# Patient Record
Sex: Female | Born: 1945 | Race: White | Hispanic: No | Marital: Married | Smoking: Never smoker
Health system: Southern US, Community
[De-identification: ages and names within clinical notes are randomized; demographics above are authoritative.]

## PROBLEM LIST (undated history)

## (undated) DIAGNOSIS — F419 Anxiety disorder, unspecified: Secondary | ICD-10-CM

## (undated) DIAGNOSIS — D219 Benign neoplasm of connective and other soft tissue, unspecified: Secondary | ICD-10-CM

## (undated) DIAGNOSIS — I10 Essential (primary) hypertension: Secondary | ICD-10-CM

## (undated) DIAGNOSIS — E785 Hyperlipidemia, unspecified: Secondary | ICD-10-CM

## (undated) DIAGNOSIS — K219 Gastro-esophageal reflux disease without esophagitis: Secondary | ICD-10-CM

## (undated) DIAGNOSIS — R51 Headache: Secondary | ICD-10-CM

## (undated) DIAGNOSIS — F410 Panic disorder [episodic paroxysmal anxiety] without agoraphobia: Secondary | ICD-10-CM

## (undated) DIAGNOSIS — M1990 Unspecified osteoarthritis, unspecified site: Secondary | ICD-10-CM

## (undated) HISTORY — DX: Unspecified osteoarthritis, unspecified site: M19.90

## (undated) HISTORY — PX: OTHER SURGICAL HISTORY: SHX169

## (undated) HISTORY — DX: Hyperlipidemia, unspecified: E78.5

## (undated) HISTORY — DX: Anxiety disorder, unspecified: F41.9

## (undated) HISTORY — DX: Headache: R51

## (undated) HISTORY — DX: Gastro-esophageal reflux disease without esophagitis: K21.9

## (undated) HISTORY — DX: Benign neoplasm of connective and other soft tissue, unspecified: D21.9

## (undated) HISTORY — DX: Essential (primary) hypertension: I10

## (undated) HISTORY — PX: BREAST CYST ASPIRATION: SHX578

## (undated) HISTORY — DX: Panic disorder (episodic paroxysmal anxiety): F41.0

## (undated) HISTORY — PX: POLYPECTOMY: SHX149

---

## 1990-05-13 DIAGNOSIS — D219 Benign neoplasm of connective and other soft tissue, unspecified: Secondary | ICD-10-CM

## 1990-05-13 HISTORY — DX: Benign neoplasm of connective and other soft tissue, unspecified: D21.9

## 1990-06-22 HISTORY — PX: DILATION AND CURETTAGE OF UTERUS: SHX78

## 2003-11-01 ENCOUNTER — Encounter: Admission: RE | Admit: 2003-11-01 | Discharge: 2004-01-30 | Payer: Self-pay | Admitting: Family Medicine

## 2004-03-04 ENCOUNTER — Encounter: Admission: RE | Admit: 2004-03-04 | Discharge: 2004-03-04 | Payer: Self-pay | Admitting: *Deleted

## 2004-04-08 ENCOUNTER — Ambulatory Visit: Payer: Self-pay

## 2004-11-13 ENCOUNTER — Ambulatory Visit: Payer: Self-pay | Admitting: Family Medicine

## 2005-07-14 ENCOUNTER — Ambulatory Visit: Payer: Self-pay

## 2006-03-04 ENCOUNTER — Ambulatory Visit: Payer: Self-pay | Admitting: Family Medicine

## 2006-07-15 ENCOUNTER — Ambulatory Visit: Payer: Self-pay

## 2007-05-23 ENCOUNTER — Ambulatory Visit: Payer: Self-pay | Admitting: Internal Medicine

## 2007-06-06 ENCOUNTER — Encounter: Payer: Self-pay | Admitting: Family Medicine

## 2007-06-06 ENCOUNTER — Ambulatory Visit: Payer: Self-pay | Admitting: Internal Medicine

## 2007-06-06 ENCOUNTER — Encounter: Payer: Self-pay | Admitting: Internal Medicine

## 2007-06-07 ENCOUNTER — Ambulatory Visit: Payer: Self-pay | Admitting: Internal Medicine

## 2007-06-14 ENCOUNTER — Ambulatory Visit: Payer: Self-pay | Admitting: Family Medicine

## 2007-06-14 LAB — CONVERTED CEMR LAB
Bilirubin Urine: NEGATIVE
Glucose, Urine, Semiquant: NEGATIVE
Nitrite: NEGATIVE
Protein, U semiquant: NEGATIVE
Specific Gravity, Urine: 1.015
Urobilinogen, UA: 0.2
WBC Urine, dipstick: NEGATIVE

## 2007-06-21 ENCOUNTER — Ambulatory Visit: Payer: Self-pay | Admitting: Family Medicine

## 2007-06-21 DIAGNOSIS — J309 Allergic rhinitis, unspecified: Secondary | ICD-10-CM | POA: Insufficient documentation

## 2007-06-21 DIAGNOSIS — E785 Hyperlipidemia, unspecified: Secondary | ICD-10-CM

## 2007-06-21 DIAGNOSIS — R51 Headache: Secondary | ICD-10-CM

## 2007-06-21 DIAGNOSIS — R519 Headache, unspecified: Secondary | ICD-10-CM | POA: Insufficient documentation

## 2007-06-21 LAB — CONVERTED CEMR LAB
AST: 32 units/L (ref 0–37)
BUN: 8 mg/dL (ref 6–23)
Basophils Absolute: 0 10*3/uL (ref 0.0–0.1)
CO2: 30 meq/L (ref 19–32)
Cholesterol: 309 mg/dL (ref 0–200)
Creatinine, Ser: 0.7 mg/dL (ref 0.4–1.2)
Eosinophils Absolute: 0.2 10*3/uL (ref 0.0–0.6)
Eosinophils Relative: 3.8 % (ref 0.0–5.0)
GFR calc Af Amer: 110 mL/min
Lymphocytes Relative: 21.4 % (ref 12.0–46.0)
Potassium: 3.7 meq/L (ref 3.5–5.1)
RBC: 3.82 M/uL — ABNORMAL LOW (ref 3.87–5.11)
RDW: 13 % (ref 11.5–14.6)
Sodium: 143 meq/L (ref 135–145)
Total Bilirubin: 1.2 mg/dL (ref 0.3–1.2)
Total Protein: 6.1 g/dL (ref 6.0–8.3)
Triglycerides: 112 mg/dL (ref 0–149)
WBC: 4.7 10*3/uL (ref 4.5–10.5)

## 2007-07-26 ENCOUNTER — Ambulatory Visit: Payer: Self-pay

## 2007-09-13 ENCOUNTER — Ambulatory Visit: Payer: Self-pay | Admitting: Family Medicine

## 2007-09-14 LAB — CONVERTED CEMR LAB
ALT: 26 units/L (ref 0–35)
AST: 28 units/L (ref 0–37)
Albumin: 3.7 g/dL (ref 3.5–5.2)
Alkaline Phosphatase: 63 units/L (ref 39–117)
Bilirubin, Direct: 0.1 mg/dL (ref 0.0–0.3)
Cholesterol: 194 mg/dL (ref 0–200)
HDL: 57.1 mg/dL (ref >39.0)
LDL Cholesterol: 124 mg/dL — ABNORMAL HIGH (ref 0–99)
Total Bilirubin: 1.1 mg/dL (ref 0.3–1.2)
Total CHOL/HDL Ratio: 3.4
Total Protein: 6 g/dL (ref 6.0–8.3)
Triglycerides: 65 mg/dL (ref 0–149)
VLDL: 13 mg/dL (ref 0–40)

## 2007-12-27 ENCOUNTER — Telehealth: Payer: Self-pay | Admitting: Family Medicine

## 2008-10-17 ENCOUNTER — Ambulatory Visit: Payer: Self-pay

## 2008-11-08 ENCOUNTER — Ambulatory Visit: Payer: Self-pay | Admitting: Family Medicine

## 2008-11-08 DIAGNOSIS — L723 Sebaceous cyst: Secondary | ICD-10-CM

## 2008-11-20 ENCOUNTER — Ambulatory Visit: Payer: Self-pay | Admitting: Family Medicine

## 2008-11-20 DIAGNOSIS — R05 Cough: Secondary | ICD-10-CM

## 2008-11-20 DIAGNOSIS — K219 Gastro-esophageal reflux disease without esophagitis: Secondary | ICD-10-CM

## 2008-11-20 DIAGNOSIS — I10 Essential (primary) hypertension: Secondary | ICD-10-CM | POA: Insufficient documentation

## 2008-12-06 ENCOUNTER — Ambulatory Visit: Payer: Self-pay | Admitting: Family Medicine

## 2008-12-28 ENCOUNTER — Telehealth: Payer: Self-pay | Admitting: Family Medicine

## 2009-01-23 ENCOUNTER — Telehealth (INDEPENDENT_AMBULATORY_CARE_PROVIDER_SITE_OTHER): Payer: Self-pay | Admitting: *Deleted

## 2009-01-24 ENCOUNTER — Telehealth (INDEPENDENT_AMBULATORY_CARE_PROVIDER_SITE_OTHER): Payer: Self-pay | Admitting: *Deleted

## 2009-06-27 ENCOUNTER — Telehealth: Payer: Self-pay | Admitting: Family Medicine

## 2009-06-28 ENCOUNTER — Ambulatory Visit: Payer: Self-pay | Admitting: Family Medicine

## 2009-08-14 ENCOUNTER — Ambulatory Visit: Payer: Self-pay | Admitting: Family Medicine

## 2009-08-15 LAB — CONVERTED CEMR LAB
AST: 21 units/L (ref 0–37)
Alkaline Phosphatase: 70 units/L (ref 39–117)
Bilirubin, Direct: 0.1 mg/dL (ref 0.0–0.3)
Sodium: 143 meq/L (ref 135–145)
Total Bilirubin: 0.6 mg/dL (ref 0.3–1.2)
Total Protein: 6.5 g/dL (ref 6.0–8.3)
VLDL: 21 mg/dL (ref 0.0–40.0)

## 2009-11-05 ENCOUNTER — Ambulatory Visit: Payer: Self-pay

## 2009-12-26 ENCOUNTER — Telehealth: Payer: Self-pay | Admitting: Family Medicine

## 2010-06-24 ENCOUNTER — Encounter: Payer: Self-pay | Admitting: Internal Medicine

## 2010-07-20 LAB — CONVERTED CEMR LAB
ALT: 24 units/L (ref 0–35)
Albumin: 3.9 g/dL (ref 3.5–5.2)
Alkaline Phosphatase: 75 units/L (ref 39–117)
BUN: 9 mg/dL (ref 6–23)
Creatinine, Ser: 0.7 mg/dL (ref 0.4–1.2)
Direct LDL: 225.4 mg/dL
Eosinophils Relative: 3.5 % (ref 0.0–5.0)
Glucose, Bld: 102 mg/dL — ABNORMAL HIGH (ref 70–99)
HCT: 38.9 % (ref 36.0–46.0)
Lymphocytes Relative: 26.1 % (ref 12.0–46.0)
Lymphs Abs: 0.7 10*3/uL (ref 0.7–4.0)
MCHC: 34.5 g/dL (ref 30.0–36.0)
MCV: 92.2 fL (ref 78.0–100.0)
Platelets: 193 10*3/uL (ref 150.0–400.0)
Potassium: 3.9 meq/L (ref 3.5–5.1)
RDW: 12.1 % (ref 11.5–14.6)
Total Bilirubin: 1.1 mg/dL (ref 0.3–1.2)
Total CHOL/HDL Ratio: 5
VLDL: 18.8 mg/dL (ref 0.0–40.0)

## 2010-07-22 NOTE — Progress Notes (Signed)
Summary: refill question  Phone Note Call from Patient   Caller: Patient Call For: Nelwyn Salisbury MD Summary of Call: Please call pt regarding her labs........ASAP.  Is upset.   [phone] Initial call taken by: Lynann Beaver CMA,  December 26, 2009 4:59 PM    New/Updated Medications: SUMATRIPTAN SUCCINATE 100 MG TABS (SUMATRIPTAN SUCCINATE) 1 as needed headache Prescriptions: SUMATRIPTAN SUCCINATE 100 MG TABS (SUMATRIPTAN SUCCINATE) 1 as needed headache  #9 x 1   Entered by:   Raechel Ache, RN   Authorized by:   Nelwyn Salisbury MD   Signed by:   Raechel Ache, RN on 12/27/2009   Method used:   Historical   RxID:   [id number]

## 2010-07-22 NOTE — Assessment & Plan Note (Signed)
Summary: change meds/dm   Vital Signs:  Patient profile:   65 year old female Weight:      157 pounds Temp:     98.6 degrees F oral Pulse rate:   95 / minute BP sitting:   200 / 120  (left arm) Cuff size:   regular  Vitals Entered By: Alfred Levins, CMA (June 28, 2009 3:27 PM) CC: discuss meds   History of Present Illness: Here for HTN and advice on high cholesterol. She watches her diet closely , but has been unable to tolerate any meds for her lipids. Statins and Zetia cause muscle aches. Even red yeast rice OTC does this to her. Also she has not been able to tolerate any BP meds except Propranolol.   Current Medications (verified): 1)  Propranolol Hcl 40 Mg  Tabs (Propranolol Hcl) .Marland Kitchen.. 1 By Mouth Two Times A Day 2)  Fish Oil   Oil (Fish Oil) .Marland Kitchen.. 1 By Mouth Once Daily 3)  Zyrtec Allergy 10 Mg  Tabs (Cetirizine Hcl) .Marland Kitchen.. 1 Once Daily Prn 4)  Vitamin D 1000 Unit  Tabs (Cholecalciferol) .... Once Daily 5)  Osteo Bi-Flex Joint Shield  Tabs (Misc Natural Products) .... Once Daily 6)  Nexium 40 Mg Cpdr (Esomeprazole Magnesium) .... Q Day 7)  Singulair 10 Mg Tabs (Montelukast Sodium) .... Once Daily 8)  Nasonex 50 Mcg/act Susp (Mometasone Furoate) .... 2 Sprays Each Nostril Once Daily  Allergies (verified): 1)  ! Penicillin 2)  ! * Statins 3)  ! Zetia (Ezetimibe) 4)  ! Norvasc 5)  ! Clonidine Hcl 6)  ! Ace Inhibitors  Past History:  Past Medical History: Reviewed history from 12/06/2008 and no changes required. Allergic rhinitis, sees Dr. Barnetta Chapel Headache Hyperlipidemia Hypertension GERD sees Dr. Luella Cook in [facility] for GYN exams  Past Surgical History: Reviewed history from 06/21/2007 and no changes required. Benign skin lesions removed, per Dr. Jarold Motto  in [facility] colonoscopy 06-06-07 per Dr. Leone Payor, repeat in 3 yrs  Review of Systems  The patient denies anorexia, fever, weight loss, weight gain, vision loss, decreased hearing, hoarseness, chest pain,  syncope, dyspnea on exertion, peripheral edema, prolonged cough, headaches, hemoptysis, abdominal pain, melena, hematochezia, severe indigestion/heartburn, hematuria, incontinence, genital sores, muscle weakness, suspicious skin lesions, transient blindness, difficulty walking, depression, unusual weight change, abnormal bleeding, enlarged lymph nodes, angioedema, breast masses, and testicular masses.    Physical Exam  General:  Well-developed,well-nourished,in no acute distress; alert,appropriate and cooperative throughout examination Neck:  No deformities, masses, or tenderness noted. Lungs:  Normal respiratory effort, chest expands symmetrically. Lungs are clear to auscultation, no crackles or wheezes. Heart:  Normal rate and regular rhythm. S1 and S2 normal without gallop, murmur, click, rub or other extra sounds. Extremities:  no edema   Impression & Recommendations:  Problem # 1:  HYPERTENSION (ICD-401.9)  The following medications were removed from the medication list:    Amlodipine Besylate 5 Mg Tabs (Amlodipine besylate) ..... Once daily    Lasix 20 Mg Tabs (Furosemide) .Marland Kitchen... Take 1 tablet by mouth once a day    Clonidine Hcl 0.1 Mg Tabs (Clonidine hcl) ..... One tablet two times a day Her updated medication list for this problem includes:    Propranolol Hcl 40 Mg Tabs (Propranolol hcl) .Marland Kitchen... 1 by mouth two times a day    Losartan Potassium 100 Mg Tabs (Losartan potassium) ..... Once daily  Problem # 2:  HYPERLIPIDEMIA (ICD-272.4)  The following medications were removed from the medication list:  Zetia 10 Mg Tabs (Ezetimibe) ..... One by mouth daily Her updated medication list for this problem includes:    Niaspan 500 Mg Cr-tabs (Niacin (antihyperlipidemic)) ..... Once daily  Complete Medication List: 1)  Propranolol Hcl 40 Mg Tabs (Propranolol hcl) .Marland Kitchen.. 1 by mouth two times a day 2)  Fish Oil Oil (Fish oil) .Marland Kitchen.. 1 by mouth once daily 3)  Zyrtec Allergy 10 Mg Tabs  (Cetirizine hcl) .Marland Kitchen.. 1 once daily prn 4)  Vitamin D 1000 Unit Tabs (Cholecalciferol) .... Once daily 5)  Osteo Bi-flex Joint Shield Tabs (Misc natural products) .... Once daily 6)  Nexium 40 Mg Cpdr (Esomeprazole magnesium) .... Q day 7)  Singulair 10 Mg Tabs (Montelukast sodium) .... Once daily 8)  Nasonex 50 Mcg/act Susp (Mometasone furoate) .... 2 sprays each nostril once daily 9)  Aspirin 81 Mg Tbec (Aspirin) .... Once daily 10)  Niaspan 500 Mg Cr-tabs (Niacin (antihyperlipidemic)) .... Once daily 11)  Losartan Potassium 100 Mg Tabs (Losartan potassium) .... Once daily  Patient Instructions: 1)  try Losartan for HTN, also try flax seed oil and Niaspan for lipids.  2)  Please schedule a follow-up appointment in 1 month.  Prescriptions: LOSARTAN POTASSIUM 100 MG TABS (LOSARTAN POTASSIUM) once daily  #30 x 11   Entered and Authorized by:   Nelwyn Salisbury MD   Signed by:   Nelwyn Salisbury MD on 06/28/2009   Method used:   [facility]ron[facility] to        [facility] [phone]* (retail)       [address] [place], [place]  [address]       Ph: [phone] or [phone]       Fax: [phone]   RxID:   [id number] NIASPAN 500 MG CR-TABS (NIACIN (ANTIHYPERLIPIDEMIC)) once daily  #30 x 11   Entered and Authorized by:   Nelwyn Salisbury MD   Signed by:   Nelwyn Salisbury MD on 06/28/2009   Method used:   Electronically to        [facility] [phone]* (retail)       [address] [place], [place]  [address]       Ph: [phone] or [phone]       Fax: [phone]   RxID:   [id number]

## 2010-07-22 NOTE — Assessment & Plan Note (Signed)
Summary: 1 month fup//ccm   Vital Signs:  Patient profile:   65 year old female Weight:      155 pounds BMI:     27.12 Temp:     97.8 degrees F oral Pulse rate:   68 / minute BP sitting:   162 / 84  (left arm) Cuff size:   regular  Vitals Entered By: Alfred Levins, CMA (August 14, 2009 8:38 AM) CC: bp check, fasting   History of Present Illness: Here to recheck lipids and BP. She has tolerated Losartan and Niaspan very well. She feels fine. Her BP at home runs 120s-130s over 80s now.   Current Medications (verified): 1)  Propranolol Hcl 40 Mg  Tabs (Propranolol Hcl) .Marland Kitchen.. 1 By Mouth Two Times A Day 2)  Fish Oil   Oil (Fish Oil) .Marland Kitchen.. 1 By Mouth Once Daily 3)  Zyrtec Allergy 10 Mg  Tabs (Cetirizine Hcl) .Marland Kitchen.. 1 Once Daily Prn 4)  Vitamin D 1000 Unit  Tabs (Cholecalciferol) .... Once Daily 5)  Osteo Bi-Flex Joint Shield  Tabs (Misc Natural Products) .... Once Daily 6)  Nexium 40 Mg Cpdr (Esomeprazole Magnesium) .... Q Day 7)  Singulair 10 Mg Tabs (Montelukast Sodium) .... Once Daily 8)  Nasonex 50 Mcg/act Susp (Mometasone Furoate) .... 2 Sprays Each Nostril Once Daily 9)  Aspirin 81 Mg Tbec (Aspirin) .... Once Daily 10)  Niaspan 500 Mg Cr-Tabs (Niacin (Antihyperlipidemic)) .... Once Daily 11)  Losartan Potassium 100 Mg Tabs (Losartan Potassium) .... Once Daily  Allergies (verified): 1)  ! Penicillin 2)  ! * Statins 3)  ! Zetia (Ezetimibe) 4)  ! Norvasc 5)  ! Clonidine Hcl 6)  ! Ace Inhibitors  Past History:  Past Medical History: Reviewed history from 12/06/2008 and no changes required. Allergic rhinitis, sees Dr. Barnetta Chapel Headache Hyperlipidemia Hypertension GERD sees Dr. Luella Cook in [place] for GYN exams  Review of Systems  The patient denies anorexia, fever, weight loss, weight gain, vision loss, decreased hearing, hoarseness, chest pain, syncope, dyspnea on exertion, peripheral edema, prolonged cough, headaches, hemoptysis, abdominal pain, melena, hematochezia,  severe indigestion/heartburn, hematuria, incontinence, genital sores, muscle weakness, suspicious skin lesions, transient blindness, difficulty walking, depression, unusual weight change, abnormal bleeding, enlarged lymph nodes, angioedema, breast masses, and testicular masses.    Physical Exam  General:  Well-developed,well-nourished,in no acute distress; alert,appropriate and cooperative throughout examination Lungs:  Normal respiratory effort, chest expands symmetrically. Lungs are clear to auscultation, no crackles or wheezes. Heart:  Normal rate and regular rhythm. S1 and S2 normal without gallop, murmur, click, rub or other extra sounds.   Impression & Recommendations:  Problem # 1:  HYPERTENSION (ICD-401.9)  Her updated medication list for this problem includes:    Propranolol Hcl 40 Mg Tabs (Propranolol hcl) .Marland Kitchen... 1 by mouth two times a day    Losartan Potassium 100 Mg Tabs (Losartan potassium) ..... Once daily  Problem # 2:  HYPERLIPIDEMIA (ICD-272.4)  Her updated medication list for this problem includes:    Niaspan 500 Mg Cr-tabs (Niacin (antihyperlipidemic)) ..... Once daily  Orders: Venipuncture ([address]) TLB-Lipid Panel (80061-LIPID) TLB-BMP (Basic Metabolic Panel-BMET) (80048-METABOL) TLB-Hepatic/Liver Function Pnl (80076-HEPATIC)  Complete Medication List: 1)  Propranolol Hcl 40 Mg Tabs (Propranolol hcl) .Marland Kitchen.. 1 by mouth two times a day 2)  Fish Oil Oil (Fish oil) .Marland Kitchen.. 1 by mouth once daily 3)  Zyrtec Allergy 10 Mg Tabs (Cetirizine hcl) .Marland Kitchen.. 1 once daily prn 4)  Vitamin D 1000 Unit Tabs (Cholecalciferol) .... Once daily 5)  Osteo  Bi-flex Joint Shield Tabs (Misc natural products) .... Once daily 6)  Nexium 40 Mg Cpdr (Esomeprazole magnesium) .... Q day 7)  Singulair 10 Mg Tabs (Montelukast sodium) .... Once daily 8)  Nasonex 50 Mcg/act Susp (Mometasone furoate) .... 2 sprays each nostril once daily 9)  Aspirin 81 Mg Tbec (Aspirin) .... Once daily 10)  Niaspan 500 Mg  Cr-tabs (Niacin (antihyperlipidemic)) .... Once daily 11)  Losartan Potassium 100 Mg Tabs (Losartan potassium) .... Once daily 12)  Nabumetone 500 Mg Tabs (Nabumetone) .... Two times a day  Patient Instructions: 1)  check labs today Prescriptions: NABUMETONE 500 MG TABS (NABUMETONE) two times a day  #60 x 11   Entered and Authorized by:   Nelwyn Salisbury MD   Signed by:   Nelwyn Salisbury MD on 08/14/2009   Method used:   Electronically to        [facility] [phone]* (retail)       [address] [place], [place]  [address]       Ph: [phone] or [phone]       Fax: [phone]   RxID:   [id number]

## 2010-07-22 NOTE — Progress Notes (Signed)
Summary: med requests  Phone Note Call from Patient   Caller: Patient Call For: Nelwyn Salisbury MD Summary of Call: Pt states the Clonidine is making her too sleepy and wants to change to Losartan.........[facility] ([place], [address]).  Also, the Zetia is causing joint pain, and she wants to try Niaspan. [phone] Initial call taken by: Lynann Beaver CMA,  June 27, 2009 10:52 AM  Follow-up for Phone Call        these are significant changes, so she needs an OV to sort this out Follow-up by: Nelwyn Salisbury MD,  June 27, 2009 12:42 PM  Additional Follow-up for Phone Call Additional follow up Details #1::        Appt scheduled. Additional Follow-up by: Lynann Beaver CMA,  June 27, 2009 12:57 PM

## 2010-07-24 NOTE — Letter (Signed)
Summary: Colonoscopy Letter  [facility]  [address]   [place], [place] [address]   Phone: [phone]  Fax: [phone]      June 24, 2010 MRN: [id number]   [facility] [place] [address] [place], [place]  [address]   Dear Ms. Pilz,   According to your medical record, it is time for you to schedule a Colonoscopy. The American Cancer Society recommends this procedure as a method to detect early colon cancer. Patients with a family history of colon cancer, or a personal history of colon polyps or inflammatory bowel disease are at increased risk.  This letter has been generated based on the recommendations made at the time of your procedure. If you feel that in your particular situation this may no longer apply, please contact our office.  Please call our office at [phone] to schedule this appointment or to update your records at your earliest convenience.  Thank you for cooperating with [place] to provide you with the very best care possible.   Sincerely,   Stan Head, M.D.  [facility] [phone]

## 2010-08-22 ENCOUNTER — Other Ambulatory Visit: Payer: Self-pay | Admitting: Family Medicine

## 2010-09-21 ENCOUNTER — Other Ambulatory Visit: Payer: Self-pay | Admitting: Family Medicine

## 2010-10-07 ENCOUNTER — Other Ambulatory Visit: Payer: Self-pay | Admitting: Family Medicine

## 2010-11-04 NOTE — Assessment & Plan Note (Signed)
[facility]                         GASTROENTEROLOGY OFFICE NOTE   NAME:Patricia Bullock, Patricia Bullock                           MRN:          [id number]  DATE:06/07/2007                            DOB:          1945-08-07    CHIEF COMPLAINT:  Pain after colonoscopy.   HISTORY:  Ms. Puryear had a colonoscopy with the removal of a 1 cm sigmoid  polyp (hot snare), yesterday.  She was fine yesterday.  She went to work  today and she was having left shoulder pain.  When she belches she feels  better.  There is also left lower quadrant pain.  She is passing flatus.  There is no nausea or vomiting or other chest pain or abdominal pain  whatsoever.  I spoke to her on the phone, because our nurse had spoken  to her about this earlier.  She took an ibuprofen and took some soda and  belched and felt better. That was what she told the nurse.  I called her  a couple of hours ago and asked her to come in to be evaluated, because  of these problems.  There is no bleeding.   PAST MEDICAL/SURGICAL HISTORY:  1. Dilatation and curettage.  2. 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  3. Adenomatous colon polyp.  4. External hemorrhoids on colonoscopy yesterday.  5. Migraine headaches.   CURRENT MEDICATIONS:  1. Nabumetone.  2. Propranolol.  3. Zyrtec.  4. Fish oil.  5. Multivitamins.  These doses are reflected in the medical history      form in the chart.  6. Imitrex taken as needed.   PHYSICAL EXAMINATION:  GENERAL:  She is in no acute distress.  NECK:  Nontender.  She has nearly a full range of motion of the neck,  though full lateral extension is a little limited.  Lateral rotation.  SHOULDERS:  She has spasm in the trapezius muscle and tenderness over  that site.  She is tender on the upper aspect of the scapula. There is  no real pain on range of motion of the shoulders.  CHEST:  There is no chest wall tenderness.  LUNGS:  Clear.  ABDOMEN:  Bowel sounds present.  She is soft, nontender.  When she  tenses her muscles in the left lower quadrant, there is some tenderness.  There is no hernia.  There is no rebound or organomegaly or mass.   ASSESSMENT:  I think she has a musculoskeletal strain, really on her  left side; and perhaps from turning her or during the procedure, if she  were bearing down, she may have tensed her abdominal wall muscles.  This  should resolve over time.  I do not think she has a post-polypectomy  syndrome or a perforation or anything like that.  I do not quite  understand why belching makes it better, since it is musculoskeletal,  but the physical examination and history all support a musculoskeletal  process.  She is afebrile today  with a temperature of 98 degrees.  The  blood pressure was  160/90, pulse 60, weight 150 pounds.   PLAN:  I have told her to continue on her nabumetone.  I let her do  that.  I did not hold that after the procedure.  She can use heat,  whirlpool, shower and range of motion exercises.  Additional Tylenol  would be okay.  She is to let me know if she has persistent problems  that do not respond to these conservative measures, or if there is any  change.     Iva Boop, MD,FACG  Electronically Signed    CEG/MedQ  DD: 06/07/2007  DT: 06/08/2007  Job #: [id number]   cc:   Jeannett Senior A. Clent Ridges, MD

## 2010-11-07 NOTE — Assessment & Plan Note (Signed)
[facility] OFFICE NOTE   NAME:Patricia Bullock, Patricia Bullock                           MRN:          [id number]  DATE:03/04/2006                            DOB:          1946/02/20    This is a 65 year old woman here for a nongynecological physical  examination.  In general she feels fine, with no complaints.  She continues  to see Dr. Corinda Gubler for allergy care.  She also sees Dr. Luella Cook for  gynecology exams.  She continues to watch her diet closely, and gets a  little bit of exercise.  Her headaches are doing reasonably well.   For further details of her past medical history, family history, social  history, habits, etcetera, I refer you to her last physical note dated January 17, 2003.   ALLERGIES:  PENICILLIN, ALL STATINS AND ZETIA.   CURRENT MEDICATIONS:  1. Propranolol 40 mg one or two tablets a day.  2. Astelin nasal spray b.i.d.  3. Nasarel sprays once a day.  4. Fish oil daily.  5. Relafen 500 mg two a day.  6. Zyrtec 10 mg per day.  7. Nexium 40 mg per day on an as-needed basis.  8. Imitrex 100 mg tablets as needed.   OBJECTIVE:  Height 5 feet 5 inches, weight 136, BP 140/80, pulse 70 and  regular.  GENERAL:  She appears to be doing well.  SKIN:  Free of significant lesions.  HEENT:  Eyes clear sclerae, clear oropharynx.  NECK:  Supple without lymphadenopathy or masses.  LUNGS:  Clear.  CARDIAC:  Rate and rhythm regular without gallops, murmurs or rubs.  Distal  pulses are full.  EKG is within normal limits.  ABDOMEN:  Soft, normal bowel sounds, nontender, no masses.  EXTREMITIES:  No clubbing, cyanosis or edema.  NEUROLOGIC:  Exam is grossly intact.   ASSESSMENT AND PLAN:  1. Complete physical exam.  We will check the usual laboratories today.      We will also set up for her first screening colonoscopy.  2. Hyperlipidemia.  We will check a fasting lipid panel today.  3. Gastroesophageal reflux disease,  stable.  4. Headaches, stable.  5. Allergies, stable.                                   Tera Mater. Clent Ridges, MD   SAF/MedQ  DD:  03/05/2006  DT:  03/05/2006  Job #:  [id number]

## 2010-12-20 ENCOUNTER — Other Ambulatory Visit: Payer: Self-pay | Admitting: Family Medicine

## 2010-12-23 ENCOUNTER — Other Ambulatory Visit: Payer: Self-pay | Admitting: *Deleted

## 2011-01-27 ENCOUNTER — Other Ambulatory Visit: Payer: Self-pay | Admitting: Family Medicine

## 2011-01-27 NOTE — Telephone Encounter (Signed)
Pt has appt 02/11/2011 - will give 30day RF - must keep appt - last seen 07/2009

## 2011-02-10 ENCOUNTER — Encounter: Payer: Self-pay | Admitting: Family Medicine

## 2011-02-11 ENCOUNTER — Ambulatory Visit (INDEPENDENT_AMBULATORY_CARE_PROVIDER_SITE_OTHER): Payer: BC Managed Care – PPO | Admitting: Family Medicine

## 2011-02-11 ENCOUNTER — Encounter: Payer: Self-pay | Admitting: Family Medicine

## 2011-02-11 VITALS — BP 120/80 | HR 78 | Temp 97.9°F | Ht 64.5 in | Wt 155.0 lb

## 2011-02-11 DIAGNOSIS — E785 Hyperlipidemia, unspecified: Secondary | ICD-10-CM

## 2011-02-11 DIAGNOSIS — Z0000 Encounter for general adult medical examination without abnormal findings: Secondary | ICD-10-CM

## 2011-02-11 LAB — HEPATIC FUNCTION PANEL
AST: 23 U/L (ref 0–37)
Bilirubin, Direct: 0.1 mg/dL (ref 0.0–0.3)

## 2011-02-11 LAB — LIPID PANEL
Cholesterol: 280 mg/dL — ABNORMAL HIGH (ref 0–200)
HDL: 58.1 mg/dL (ref >39.00)
Triglycerides: 123 mg/dL (ref 0.0–149.0)

## 2011-02-11 LAB — POCT URINALYSIS DIPSTICK
Leukocytes, UA: NEGATIVE
Nitrite, UA: NEGATIVE
Protein, UA: NEGATIVE
Urobilinogen, UA: 0.2
pH, UA: 6

## 2011-02-11 LAB — CBC WITH DIFFERENTIAL/PLATELET
Basophils Absolute: 0 10*3/uL (ref 0.0–0.1)
HCT: 39.7 % (ref 36.0–46.0)
Hemoglobin: 13.2 g/dL (ref 12.0–15.0)
Lymphs Abs: 0.9 10*3/uL (ref 0.7–4.0)
MCHC: 33.3 g/dL (ref 30.0–36.0)
MCV: 91.7 fl (ref 78.0–100.0)
Monocytes Absolute: 0.2 10*3/uL (ref 0.1–1.0)
Platelets: 198 10*3/uL (ref 150.0–400.0)
RDW: 13.7 % (ref 11.5–14.6)
WBC: 3.6 10*3/uL — ABNORMAL LOW (ref 4.5–10.5)

## 2011-02-11 LAB — BASIC METABOLIC PANEL
BUN: 12 mg/dL (ref 6–23)
Creatinine, Ser: 0.7 mg/dL (ref 0.4–1.2)
Glucose, Bld: 104 mg/dL — ABNORMAL HIGH (ref 70–99)
Potassium: 4.1 mEq/L (ref 3.5–5.1)
Sodium: 144 mEq/L (ref 135–145)

## 2011-02-11 MED ORDER — ESOMEPRAZOLE MAGNESIUM 40 MG PO CPDR: 40.0000 mg | DELAYED_RELEASE_CAPSULE | Freq: Every day | ORAL | Status: DC

## 2011-02-11 MED ORDER — MONTELUKAST SODIUM 10 MG PO TABS: 10.0000 mg | ORAL_TABLET | Freq: Every day | ORAL | Status: DC

## 2011-02-11 MED ORDER — NABUMETONE 500 MG PO TABS: 500.0000 mg | ORAL_TABLET | Freq: Two times a day (BID) | ORAL | Status: DC

## 2011-02-11 MED ORDER — SCOPOLAMINE 1 MG/3DAYS TD PT72: 1.0000 | MEDICATED_PATCH | TRANSDERMAL | Status: DC

## 2011-02-11 MED ORDER — PROPRANOLOL HCL 40 MG PO TABS: 40.0000 mg | ORAL_TABLET | Freq: Two times a day (BID) | ORAL | Status: DC

## 2011-02-11 MED ORDER — MOMETASONE FUROATE 50 MCG/ACT NA SUSP: 2.0000 | Freq: Every day | NASAL | Status: DC

## 2011-02-11 MED ORDER — LOSARTAN POTASSIUM 100 MG PO TABS: 100.0000 mg | ORAL_TABLET | Freq: Every day | ORAL | Status: DC

## 2011-02-11 NOTE — Progress Notes (Signed)
  Subjective:    Patient ID: Patricia Bullock, female    DOB: 05-28-1946, 65 y.o.   MRN: [id number]  HPI 65 yr old female for a cpx. She feels well and has no complaints. She is seeing her GYN regularly. She is past due for a colonoscopy.    Review of Systems  Constitutional: Negative.   HENT: Negative.   Eyes: Negative.   Respiratory: Negative.   Cardiovascular: Negative.   Gastrointestinal: Negative.   Genitourinary: Negative for dysuria, urgency, frequency, hematuria, flank pain, decreased urine volume, enuresis, difficulty urinating, pelvic pain and dyspareunia.  Musculoskeletal: Negative.   Skin: Negative.   Neurological: Negative.   Hematological: Negative.   Psychiatric/Behavioral: Negative.        Objective:   Physical Exam  Constitutional: She is oriented to person, place, and time. She appears well-developed and well-nourished. No distress.  HENT:  Head: Normocephalic and atraumatic.  Right Ear: External ear normal.  Left Ear: External ear normal.  Nose: Nose normal.  Mouth/Throat: Oropharynx is clear and moist. No oropharyngeal exudate.  Eyes: Conjunctivae and EOM are normal. Pupils are equal, round, and reactive to light. No scleral icterus.  Neck: Normal range of motion. Neck supple. No JVD present. No thyromegaly present.  Cardiovascular: Normal rate, regular rhythm, normal heart sounds and intact distal pulses.  Exam reveals no gallop and no friction rub.   No murmur heard.      EKG normal   Pulmonary/Chest: Effort normal and breath sounds normal. No respiratory distress. She has no wheezes. She has no rales. She exhibits no tenderness.  Abdominal: Soft. Bowel sounds are normal. She exhibits no distension and no mass. There is no tenderness. There is no rebound and no guarding.  Musculoskeletal: Normal range of motion. She exhibits no edema and no tenderness.  Lymphadenopathy:    She has no cervical adenopathy.  Neurological: She is alert and oriented to person,  place, and time. She has normal reflexes. No cranial nerve deficit. She exhibits normal muscle tone. Coordination normal.  Skin: Skin is warm and dry. No rash noted. No erythema.  Psychiatric: She has a normal mood and affect. Her behavior is normal. Judgment and thought content normal.          Assessment & Plan:  Get fasting labs today. Reminded her to set up another colonoscopy soon

## 2011-02-12 ENCOUNTER — Other Ambulatory Visit: Payer: Self-pay | Admitting: Family Medicine

## 2011-02-12 ENCOUNTER — Ambulatory Visit: Payer: Self-pay

## 2011-02-12 MED ORDER — EZETIMIBE 10 MG PO TABS: 10.0000 mg | ORAL_TABLET | Freq: Every day | ORAL | Status: DC

## 2011-02-12 NOTE — Progress Notes (Signed)
Addended by: Aniceto Boss A on: 02/12/2011 03:34 PM   Modules accepted: Orders

## 2011-04-17 ENCOUNTER — Telehealth: Payer: Self-pay | Admitting: Family Medicine

## 2011-04-17 MED ORDER — PRAVASTATIN SODIUM 10 MG PO TABS: 10.0000 mg | ORAL_TABLET | Freq: Every day | ORAL | Status: DC

## 2011-04-17 NOTE — Telephone Encounter (Signed)
She wants to try a low dose of pravachol

## 2011-06-17 ENCOUNTER — Other Ambulatory Visit: Payer: Self-pay | Admitting: Family Medicine

## 2011-10-08 ENCOUNTER — Ambulatory Visit (INDEPENDENT_AMBULATORY_CARE_PROVIDER_SITE_OTHER): Payer: BC Managed Care – PPO | Admitting: Family Medicine

## 2011-10-08 ENCOUNTER — Emergency Department: Payer: Self-pay | Admitting: Emergency Medicine

## 2011-10-08 ENCOUNTER — Telehealth: Payer: Self-pay | Admitting: Family Medicine

## 2011-10-08 ENCOUNTER — Encounter: Payer: Self-pay | Admitting: Family Medicine

## 2011-10-08 VITALS — BP 190/100 | Temp 98.3°F | Wt 160.0 lb

## 2011-10-08 DIAGNOSIS — I10 Essential (primary) hypertension: Secondary | ICD-10-CM

## 2011-10-08 MED ORDER — HYDROCHLOROTHIAZIDE 12.5 MG PO CAPS: 12.5000 mg | ORAL_CAPSULE | Freq: Every day | ORAL | Status: DC

## 2011-10-08 NOTE — Telephone Encounter (Signed)
Walk in - patient concerned about blood pressure.  Patient waiting in [place].

## 2011-10-08 NOTE — Progress Notes (Signed)
  Subjective:    Patient ID: Patricia Bullock, female    DOB: August 24, 1945, 66 y.o.   MRN: [id number]  HPI  Acute visit. Patient seen for elevated blood pressure. She has history of hypertension treated with losartan and propranolol. Earlier today she noticed some bilateral tingling sensation in fingers and feeling of fullness in her head and neck. No chest pain. Initial blood pressure here 200/118. She is dealing with a lot of stress which she thinks may be exacerbating.  Denies any edema. Follows a low-sodium diet. Nonsmoker. No alcohol. No nonsteroidals. She's had similar exacerbation previously. Denies any focal weakness.  Compliant with regular medications.  Past Medical History  Diagnosis Date  . Allergic rhinitis   . Headache   . Hyperlipidemia   . Hypertension   . GERD (gastroesophageal reflux disease)    Past Surgical History  Procedure Date  . Benign skin lesions removed   . Colonoscopy 06/06/2007    per Dr. Leone Payor, hyperplastic polyps, repeat in 3 yrs     reports that she has never smoked. She has never used smokeless tobacco. She reports that she does not drink alcohol or use illicit drugs. family history includes Cancer in an unspecified family member; Dementia in her father; Diabetes in an unspecified family member; Hyperlipidemia in an unspecified family member; and Hypertension in an unspecified family member. Allergies  Allergen Reactions  . Ace Inhibitors     REACTION: cough  . Amlodipine Besylate     REACTION: swelling  . Clonidine Hydrochloride     REACTION: sleepiness  . Ezetimibe     REACTION: muscle aches  . Penicillins   . Statins     REACTION: muscle aches      Review of Systems  Constitutional: Negative for fever, activity change, appetite change, fatigue and unexpected weight change.  HENT: Negative for hearing loss, sore throat and trouble swallowing.   Eyes: Negative for visual disturbance.  Respiratory: Negative for cough, chest tightness, shortness  of breath and wheezing.   Cardiovascular: Negative for chest pain, palpitations and leg swelling.  Gastrointestinal: Negative for nausea, vomiting, abdominal pain, blood in stool and abdominal distention.  Genitourinary: Negative for dysuria and hematuria.  Musculoskeletal: Negative for myalgias and gait problem.  Skin: Negative for rash.  Neurological: Negative for dizziness, seizures, syncope, weakness, light-headedness and headaches.  Hematological: Negative for adenopathy.  Psychiatric/Behavioral: Negative for confusion and dysphoric mood.       Objective:   Physical Exam  Constitutional: She is oriented to person, place, and time. She appears well-developed and well-nourished. No distress.  Eyes: Pupils are equal, round, and reactive to light.       Fundi benign. No retinal hemorrhages.  Neck: Neck supple.  Cardiovascular: Normal rate and regular rhythm.   Pulmonary/Chest: Effort normal and breath sounds normal. No respiratory distress. She has no wheezes. She has no rales.  Musculoskeletal: She exhibits no edema.  Neurological: She is alert and oriented to person, place, and time. No cranial nerve deficit.          Assessment & Plan:  Hypertension with exacerbation. Repeat blood pressure after rest 190/100. Add HCTZ 12.5 mg once daily to her other medications and followup with primary physician in one week.

## 2011-10-08 NOTE — Telephone Encounter (Signed)
Pt has no complaints except tingling of both hands and feeling of being scared her BP is very high.  BP reading at Triage was 200/118.  Pt will be worked in with Dr. Caryl Never per Harriett Sine.

## 2011-10-08 NOTE — Patient Instructions (Signed)

## 2011-10-09 ENCOUNTER — Telehealth: Payer: Self-pay | Admitting: *Deleted

## 2011-10-09 LAB — COMPREHENSIVE METABOLIC PANEL
Albumin: 3.8 g/dL (ref 3.4–5.0)
Anion Gap: 10 (ref 7–16)
BUN: 13 mg/dL (ref 7–18)
Bilirubin,Total: 0.3 mg/dL (ref 0.2–1.0)
Calcium, Total: 9.4 mg/dL (ref 8.5–10.1)
EGFR (Non-African Amer.): >60
Glucose: 123 mg/dL — ABNORMAL HIGH (ref 65–99)
Potassium: 3.7 mmol/L (ref 3.5–5.1)
Sodium: 144 mmol/L (ref 136–145)

## 2011-10-09 LAB — CBC
HCT: 39.4 % (ref 35.0–47.0)
MCH: 30 pg (ref 26.0–34.0)
MCHC: 33.5 g/dL (ref 32.0–36.0)
Platelet: 220 10*3/uL (ref 150–440)
RBC: 4.4 10*6/uL (ref 3.80–5.20)
RDW: 13.4 % (ref 11.5–14.5)

## 2011-10-09 LAB — CK TOTAL AND CKMB (NOT AT ARMC): CK-MB: 2.2 ng/mL (ref 0.5–3.6)

## 2011-10-09 LAB — TROPONIN I
Troponin-I: <0.02 ng/mL
Troponin-I: <0.02 ng/mL

## 2011-10-09 NOTE — Telephone Encounter (Signed)
Pt went to the ER with an anxiety attack last night and was given Clonazepam to take until she can see Dr. Clent Ridges next week.  Just wanting to make sure this is ok with Dr. Clent Ridges.  Assured her that there should be no reason not to follow through with the ER's instructions until she can be evaluated here Tuesday.  She is not symptomatic at this time.

## 2011-10-09 NOTE — Telephone Encounter (Signed)
agreed

## 2011-10-13 ENCOUNTER — Ambulatory Visit (INDEPENDENT_AMBULATORY_CARE_PROVIDER_SITE_OTHER): Payer: BC Managed Care – PPO | Admitting: Family Medicine

## 2011-10-13 ENCOUNTER — Encounter: Payer: Self-pay | Admitting: Family Medicine

## 2011-10-13 VITALS — BP 140/86 | HR 91 | Temp 98.2°F | Wt 157.0 lb

## 2011-10-13 DIAGNOSIS — F419 Anxiety disorder, unspecified: Secondary | ICD-10-CM

## 2011-10-13 DIAGNOSIS — F411 Generalized anxiety disorder: Secondary | ICD-10-CM

## 2011-10-13 DIAGNOSIS — I10 Essential (primary) hypertension: Secondary | ICD-10-CM

## 2011-10-13 MED ORDER — HYDROCHLOROTHIAZIDE 25 MG PO TABS: 25.0000 mg | ORAL_TABLET | Freq: Every day | ORAL | Status: AC

## 2011-10-13 MED ORDER — CITALOPRAM HYDROBROMIDE 20 MG PO TABS: 20.0000 mg | ORAL_TABLET | Freq: Every day | ORAL | Status: DC

## 2011-10-13 NOTE — Progress Notes (Signed)
  Subjective:    Patient ID: Patricia Bullock, female    DOB: 04/17/1946, 66 y.o.   MRN: [id number]  HPI Here to follow up on anxiety and HTN. She was seen here on 10-08-11 with an initial BP of 200/118, then this went down to 190/100. She complained of tingling in the head and the hands but had no chest pain or SOB. She was told to add HCTZ 12.5 mg a day to her usual regimen and to follow up with me. However on the drive home the tingling feelings returned and she felt extremely anxious. She then went to [facility] ER, where her BP was up again. At that point she was diagnosed with extreme anxiety and possible panic attacks. She was given Clonazepam to use prn, and this has been very helpful. When she feels anxious she takes one, and it helps a lot. She admits to being under a tremendous amount of stress at work and with her family.    Review of Systems  Constitutional: Negative.   Respiratory: Negative.   Cardiovascular: Negative.   Neurological: Positive for numbness. Negative for tremors, seizures, syncope, facial asymmetry, speech difficulty, weakness, light-headedness and headaches.  Psychiatric/Behavioral: Positive for decreased concentration and agitation. Negative for dysphoric mood. The patient is nervous/anxious.        Objective:   Physical Exam  Constitutional: She appears well-developed and well-nourished.  Neck: No thyromegaly present.  Cardiovascular: Normal rate, regular rhythm, normal heart sounds and intact distal pulses.  Exam reveals no gallop and no friction rub.   No murmur heard. Pulmonary/Chest: Effort normal and breath sounds normal.  Lymphadenopathy:    She has no cervical adenopathy.  Psychiatric: Her behavior is normal. Thought content normal.       Mildly anxious           Assessment & Plan:  For the HTN, we will increase the HCTZ to 25 mg a day. Add Celexa daily for anxiety. Recheck in one month

## 2011-10-15 ENCOUNTER — Telehealth: Payer: Self-pay | Admitting: *Deleted

## 2011-10-15 NOTE — Telephone Encounter (Signed)
Stop the Celexa. Call in Clonazepam #90 with 5 rf

## 2011-10-15 NOTE — Telephone Encounter (Signed)
Citalopram gave pt migraines, vertigo, and insomnia.  Caused her to itch all over.  She likes the Clonazepam.

## 2011-10-16 ENCOUNTER — Telehealth: Payer: Self-pay | Admitting: *Deleted

## 2011-10-16 MED ORDER — CLONAZEPAM 0.5 MG PO TABS
0.5000 mg | ORAL_TABLET | Freq: Three times a day (TID) | ORAL | Status: DC | PRN
Start: 2011-10-16 — End: 2017-09-14

## 2011-10-16 NOTE — Telephone Encounter (Signed)
I spoke with pt and Dr. Clent Ridges did recommend to stop the diuretic, also script was called in.

## 2011-10-16 NOTE — Telephone Encounter (Signed)
Pt is having severe migraines, and feels it is coming from the diuretics.  BP is running 125/62, Pulse: 65. Advised to push Gatorade, and all fluids, and we will ask  Dr. Clent Ridges if you need to make any med changes.  Clonazepam .5 mg one po tid prn anxiety and insomnia called to pharmacy. Pt is asking to PLEASE call her back before the weekend with some advice.

## 2011-10-16 NOTE — Telephone Encounter (Signed)
I spoke with pt and script called in. Dr.Fry did recommend to stop diuretic for now.

## 2011-11-10 ENCOUNTER — Telehealth: Payer: Self-pay | Admitting: Family Medicine

## 2011-11-10 NOTE — Telephone Encounter (Signed)
Open in error

## 2011-11-12 ENCOUNTER — Ambulatory Visit (INDEPENDENT_AMBULATORY_CARE_PROVIDER_SITE_OTHER): Payer: BC Managed Care – PPO | Admitting: Family Medicine

## 2011-11-12 ENCOUNTER — Encounter: Payer: Self-pay | Admitting: Family Medicine

## 2011-11-12 VITALS — BP 140/90 | HR 83 | Temp 98.3°F | Wt 154.0 lb

## 2011-11-12 DIAGNOSIS — F419 Anxiety disorder, unspecified: Secondary | ICD-10-CM

## 2011-11-12 DIAGNOSIS — F411 Generalized anxiety disorder: Secondary | ICD-10-CM

## 2011-11-12 DIAGNOSIS — I10 Essential (primary) hypertension: Secondary | ICD-10-CM

## 2011-11-12 NOTE — Progress Notes (Signed)
  Subjective:    Patient ID: Patricia Bullock, female    DOB: 03-25-1946, 66 y.o.   MRN: [id number]  HPI Here to follow up on anxiety. She tried a few doses of Celexa but had to stop due to side effects. Then she tried a few doses of Sertraline (that she got from her cardiologist) but had to stop this due to side effects. She has done very well however on Clonazepam. This alleviates her anxiety and she tolerates it well. She averages 2 doses a day. Otherwise doing well. Her BP is stable.    Review of Systems  Constitutional: Negative.   Respiratory: Negative.   Cardiovascular: Negative.   Psychiatric/Behavioral: The patient is nervous/anxious.        Objective:   Physical Exam  Constitutional: She appears well-developed and well-nourished.  Cardiovascular: Normal rate, regular rhythm, normal heart sounds and intact distal pulses.   Pulmonary/Chest: Effort normal and breath sounds normal.  Psychiatric: She has a normal mood and affect. Her behavior is normal. Thought content normal.          Assessment & Plan:  She will take 1/2 of a tablet of Clonazepam bid and then use it in between on a prn basis. Recheck in 90 days

## 2012-02-11 ENCOUNTER — Other Ambulatory Visit: Payer: Self-pay | Admitting: Family Medicine

## 2012-02-16 ENCOUNTER — Telehealth: Payer: Self-pay | Admitting: Family Medicine

## 2012-02-16 NOTE — Telephone Encounter (Signed)
Tell her to stop the Propranolol but stay on the Cozaar.

## 2012-02-16 NOTE — Telephone Encounter (Signed)
Caller: [place]/Patient; Patient Name: Patricia Bullock; PCP: Gershon Crane [facility]); Best Callback Phone Number: [phone].  Call regarding Blood Pressure at a normal range on consistent basis since June 2013.  Patient walks 8 miles a day and has lost 14 lbs.  BP 112/77 on 8-27.  Patient denies having any more panic attacks since walking began.  Patient is going on vacation on 8-29 and would like to discuss whether she should reduce her HTN meds.  Losartan & 2 Beta Blockers on a daily basis.  All emergent symptoms ruled out per Hyptertension Protocol, home care advice.  Please have MD review and follow up with Patient regarding HTN meds.

## 2012-02-17 NOTE — Telephone Encounter (Signed)
I left voice message with below information. 

## 2012-03-08 ENCOUNTER — Encounter: Payer: Self-pay | Admitting: Internal Medicine

## 2012-03-12 ENCOUNTER — Other Ambulatory Visit: Payer: Self-pay | Admitting: Family Medicine

## 2012-03-27 ENCOUNTER — Other Ambulatory Visit: Payer: Self-pay | Admitting: Family Medicine

## 2012-05-14 ENCOUNTER — Other Ambulatory Visit: Payer: Self-pay | Admitting: Family Medicine

## 2012-08-30 ENCOUNTER — Telehealth: Payer: Self-pay | Admitting: Family Medicine

## 2012-08-30 MED ORDER — PROPRANOLOL HCL 40 MG PO TABS: 40.0000 mg | ORAL_TABLET | Freq: Two times a day (BID) | ORAL | Status: DC

## 2012-08-30 NOTE — Telephone Encounter (Signed)
Refill request for Propranolol and I did send e-scribe

## 2012-11-17 ENCOUNTER — Ambulatory Visit (INDEPENDENT_AMBULATORY_CARE_PROVIDER_SITE_OTHER): Payer: BC Managed Care – PPO | Admitting: Family Medicine

## 2012-11-17 ENCOUNTER — Encounter: Payer: Self-pay | Admitting: Family Medicine

## 2012-11-17 VITALS — BP 154/90 | HR 68 | Temp 98.1°F | Ht 63.75 in | Wt 150.0 lb

## 2012-11-17 DIAGNOSIS — Z0000 Encounter for general adult medical examination without abnormal findings: Secondary | ICD-10-CM

## 2012-11-17 LAB — CBC WITH DIFFERENTIAL/PLATELET
Basophils Relative: 0.5 % (ref 0.0–3.0)
Eosinophils Relative: 4.8 % (ref 0.0–5.0)
HCT: 40 % (ref 36.0–46.0)
Lymphs Abs: 1.4 10*3/uL (ref 0.7–4.0)
MCV: 88.4 fl (ref 78.0–100.0)
Monocytes Absolute: 0.3 10*3/uL (ref 0.1–1.0)
Monocytes Relative: 8.5 % (ref 3.0–12.0)
RBC: 4.52 Mil/uL (ref 3.87–5.11)
WBC: 3.7 10*3/uL — ABNORMAL LOW (ref 4.5–10.5)

## 2012-11-17 LAB — POCT URINALYSIS DIPSTICK
Bilirubin, UA: NEGATIVE
Blood, UA: NEGATIVE
Ketones, UA: NEGATIVE
Spec Grav, UA: 1.02
pH, UA: 6

## 2012-11-17 LAB — HEPATIC FUNCTION PANEL
AST: 23 U/L (ref 0–37)
Alkaline Phosphatase: 65 U/L (ref 39–117)
Bilirubin, Direct: 0.1 mg/dL (ref 0.0–0.3)
Total Bilirubin: 0.9 mg/dL (ref 0.3–1.2)

## 2012-11-17 LAB — LIPID PANEL
HDL: 57.5 mg/dL (ref >39.00)
Total CHOL/HDL Ratio: 5
VLDL: 16 mg/dL (ref 0.0–40.0)

## 2012-11-17 LAB — BASIC METABOLIC PANEL
GFR: 89.3 mL/min (ref >60.00)
Glucose, Bld: 96 mg/dL (ref 70–99)
Potassium: 3.9 mEq/L (ref 3.5–5.1)
Sodium: 141 mEq/L (ref 135–145)

## 2012-11-17 LAB — TSH: TSH: 2.07 u[IU]/mL (ref 0.35–5.50)

## 2012-11-17 MED ORDER — SUMATRIPTAN SUCCINATE 100 MG PO TABS: 100.0000 mg | ORAL_TABLET | ORAL | Status: DC | PRN

## 2012-11-17 MED ORDER — MONTELUKAST SODIUM 10 MG PO TABS: ORAL_TABLET | ORAL | Status: DC

## 2012-11-17 MED ORDER — LOSARTAN POTASSIUM 100 MG PO TABS: ORAL_TABLET | ORAL | Status: DC

## 2012-11-17 MED ORDER — MOMETASONE FUROATE 50 MCG/ACT NA SUSP: 2.0000 | Freq: Every day | NASAL | Status: DC

## 2012-11-17 MED ORDER — CLONAZEPAM 0.5 MG PO TABS: 0.5000 mg | ORAL_TABLET | Freq: Three times a day (TID) | ORAL | Status: DC | PRN

## 2012-11-17 MED ORDER — ESOMEPRAZOLE MAGNESIUM 40 MG PO CPDR: 40.0000 mg | DELAYED_RELEASE_CAPSULE | Freq: Every day | ORAL | Status: DC

## 2012-11-17 NOTE — Progress Notes (Signed)
  Subjective:    Patient ID: Patricia Bullock, female    DOB: [birth year], 67 y.o.   MRN: [id number]  HPI 67 yr old female for a cpx. She feels well. She has been exercising over the past year and her BP has gone down dramatically. This is usually 90 to 100 systolic at home. As we have discussed previously, she is 3 years past due for a colonoscopy. However she had a bad experience with her last one and she will only consent to getting another on if she has a different doctor perform it.    Review of Systems  Constitutional: Negative.   HENT: Negative.   Eyes: Negative.   Respiratory: Negative.   Cardiovascular: Negative.   Gastrointestinal: Negative.   Genitourinary: Negative for dysuria, urgency, frequency, hematuria, flank pain, decreased urine volume, enuresis, difficulty urinating, pelvic pain and dyspareunia.  Musculoskeletal: Negative.   Skin: Negative.   Neurological: Negative.   Psychiatric/Behavioral: Negative.        Objective:   Physical Exam  Constitutional: She is oriented to person, place, and time. She appears well-developed and well-nourished. No distress.  HENT:  Head: Normocephalic and atraumatic.  Right Ear: External ear normal.  Left Ear: External ear normal.  Nose: Nose normal.  Mouth/Throat: Oropharynx is clear and moist. No oropharyngeal exudate.  Eyes: Conjunctivae and EOM are normal. Pupils are equal, round, and reactive to light. No scleral icterus.  Neck: Normal range of motion. Neck supple. No JVD present. No thyromegaly present.  Cardiovascular: Normal rate, regular rhythm, normal heart sounds and intact distal pulses.  Exam reveals no gallop and no friction rub.   No murmur heard. EKG normal   Pulmonary/Chest: Effort normal and breath sounds normal. No respiratory distress. She has no wheezes. She has no rales. She exhibits no tenderness.  Abdominal: Soft. Bowel sounds are normal. She exhibits no distension and no mass. There is no tenderness. There is no  rebound and no guarding.  Musculoskeletal: Normal range of motion. She exhibits no edema and no tenderness.  Lymphadenopathy:    She has no cervical adenopathy.  Neurological: She is alert and oriented to person, place, and time. She has normal reflexes. No cranial nerve deficit. She exhibits normal muscle tone. Coordination normal.  Skin: Skin is warm and dry. No rash noted. No erythema.  Psychiatric: She has a normal mood and affect. Her behavior is normal. Judgment and thought content normal.          Assessment & Plan:  Well exam. Get fasting labs. I urged her to contact the GI office and if this doesn't work out we can refer her to another GI group.

## 2012-11-22 NOTE — Progress Notes (Signed)
Quick Note:  I left voice message with results. ______ 

## 2012-11-23 ENCOUNTER — Telehealth: Payer: Self-pay | Admitting: Family Medicine

## 2012-11-23 NOTE — Telephone Encounter (Signed)
Pt left voice message, she wants to know if she should start on Niaspan due to recent lab results?

## 2012-11-23 NOTE — Telephone Encounter (Signed)
That is actually a great idea. Call in Niaspan 500 mg to take once a day, #30 with 11 rf. Recheck lipids in 6 months

## 2012-11-25 MED ORDER — NIACIN ER (ANTIHYPERLIPIDEMIC) 500 MG PO TBCR: 500.0000 mg | EXTENDED_RELEASE_TABLET | Freq: Every day | ORAL | Status: DC

## 2012-11-25 NOTE — Telephone Encounter (Signed)
I spoke with pt and sent script e-scribe. 

## 2012-11-25 NOTE — Telephone Encounter (Signed)
I left voice message for pt to return my call regarding the below medication.

## 2012-12-16 ENCOUNTER — Telehealth: Payer: Self-pay | Admitting: Family Medicine

## 2012-12-16 MED ORDER — PROPRANOLOL HCL 40 MG PO TABS: 40.0000 mg | ORAL_TABLET | Freq: Two times a day (BID) | ORAL | Status: DC

## 2012-12-16 NOTE — Addendum Note (Signed)
Addended by: Aniceto Boss A on: 12/16/2012 02:35 PM   Modules accepted: Orders

## 2012-12-16 NOTE — Telephone Encounter (Signed)
Okay for one year  

## 2012-12-16 NOTE — Telephone Encounter (Signed)
I did send script e-scribe.  

## 2012-12-16 NOTE — Telephone Encounter (Signed)
Refill request for Propranolol 40 mg take 1 po bid. Can we refill this?

## 2012-12-27 ENCOUNTER — Telehealth: Payer: Self-pay | Admitting: Internal Medicine

## 2012-12-27 NOTE — Telephone Encounter (Signed)
Dr. Jarold Motto will see patient, please inform them he is retiring next Fall.

## 2012-12-27 NOTE — Telephone Encounter (Signed)
Ok..tell her I am retiring in 1 year however

## 2012-12-27 NOTE — Telephone Encounter (Signed)
Dr. Leone Payor do you approve of switch?

## 2012-12-27 NOTE — Telephone Encounter (Signed)
I approve of switch.

## 2012-12-27 NOTE — Telephone Encounter (Signed)
Dr. Jarold Motto will you accept

## 2012-12-27 NOTE — Telephone Encounter (Signed)
Spoke w/pt and infomed her that Dr. Jarold Motto is retiring next year. She said she is driving and would CB to sch'd an appt with Dr. Jarold Motto

## 2012-12-28 ENCOUNTER — Encounter: Payer: Self-pay | Admitting: Gastroenterology

## 2013-02-08 ENCOUNTER — Encounter: Payer: BC Managed Care – PPO | Admitting: Gastroenterology

## 2013-03-09 ENCOUNTER — Ambulatory Visit (AMBULATORY_SURGERY_CENTER): Payer: Medicare Other | Admitting: *Deleted

## 2013-03-09 VITALS — Ht 65.0 in | Wt 152.4 lb

## 2013-03-09 DIAGNOSIS — Z860100 Personal history of colon polyps, unspecified: Secondary | ICD-10-CM

## 2013-03-09 MED ORDER — MOVIPREP 100 G PO SOLR: 1.0000 | Freq: Once | ORAL | Status: DC

## 2013-03-09 NOTE — Progress Notes (Signed)
Denies allergies to eggs or soy products. Denies complications with sedation or anesthesia. 

## 2013-03-10 ENCOUNTER — Encounter: Payer: Self-pay | Admitting: Gastroenterology

## 2013-03-23 ENCOUNTER — Encounter: Payer: Self-pay | Admitting: Gastroenterology

## 2013-03-23 ENCOUNTER — Ambulatory Visit (AMBULATORY_SURGERY_CENTER): Payer: Medicare Other | Admitting: Gastroenterology

## 2013-03-23 VITALS — BP 175/85 | HR 63 | Temp 97.6°F | Resp 17 | Ht 65.0 in | Wt 152.0 lb

## 2013-03-23 DIAGNOSIS — D126 Benign neoplasm of colon, unspecified: Secondary | ICD-10-CM

## 2013-03-23 DIAGNOSIS — Z800 Family history of malignant neoplasm of digestive organs: Secondary | ICD-10-CM

## 2013-03-23 DIAGNOSIS — Z86010 Personal history of colonic polyps: Secondary | ICD-10-CM

## 2013-03-23 DIAGNOSIS — K5730 Diverticulosis of large intestine without perforation or abscess without bleeding: Secondary | ICD-10-CM

## 2013-03-23 MED ORDER — SODIUM CHLORIDE 0.9 % IV SOLN: 500.0000 mL | INTRAVENOUS | Status: DC

## 2013-03-23 NOTE — Patient Instructions (Addendum)
YOU HAD AN ENDOSCOPIC PROCEDURE TODAY AT THE [facility]: Refer to the procedure report that was given to you for any specific questions about what was found during the examination.  If the procedure report does not answer your questions, please call your gastroenterologist to clarify.  If you requested that your care partner not be given the details of your procedure findings, then the procedure report has been included in a sealed envelope for you to review at your convenience later.  YOU SHOULD EXPECT: Some feelings of bloating in the abdomen. Passage of more gas than usual.  Walking can help get rid of the air that was put into your GI tract during the procedure and reduce the bloating. If you had a lower endoscopy (such as a colonoscopy or flexible sigmoidoscopy) you may notice spotting of blood in your stool or on the toilet paper. If you underwent a bowel prep for your procedure, then you may not have a normal bowel movement for a few days.  DIET: Your first meal following the procedure should be a light meal and then it is ok to progress to your normal diet.  A half-sandwich or bowl of soup is an example of a good first meal.  Heavy or fried foods are harder to digest and may make you feel nauseous or bloated.  Likewise meals heavy in dairy and vegetables can cause extra gas to form and this can also increase the bloating.  Drink plenty of fluids but you should avoid alcoholic beverages for 24 hours.  ACTIVITY: Your care partner should take you home directly after the procedure.  You should plan to take it easy, moving slowly for the rest of the day.  You can resume normal activity the day after the procedure however you should NOT DRIVE or use heavy machinery for 24 hours (because of the sedation medicines used during the test).    SYMPTOMS TO REPORT IMMEDIATELY: A gastroenterologist can be reached at any hour.  During normal business hours, 8:30 AM to 5:00 PM Monday through Friday,  call [phone].  After hours and on weekends, please call the GI answering service at [phone] who will take a message and have the physician on call contact you.   Following lower endoscopy (colonoscopy or flexible sigmoidoscopy):  Excessive amounts of blood in the stool  Significant tenderness or worsening of abdominal pains  Swelling of the abdomen that is new, acute  Fever of 100F or higher  FOLLOW UP: If any biopsies were taken you will be contacted by phone or by letter within the next 1-3 weeks.  Call your gastroenterologist if you have not heard about the biopsies in 3 weeks.  Our staff will call the home number listed on your records the next business day following your procedure to check on you and address any questions or concerns that you may have at that time regarding the information given to you following your procedure. This is a courtesy call and so if there is no answer at the home number and we have not heard from you through the emergency physician on call, we will assume that you have returned to your regular daily activities without incident.  SIGNATURES/CONFIDENTIALITY: You and/or your care partner have signed paperwork which will be entered into your electronic medical record.  These signatures attest to the fact that that the information above on your After Visit Summary has been reviewed and is understood.  Full responsibility of the confidentiality of this   discharge information lies with you and/or your care-partner.  Polyp, diverticulosis, high fiber diet-handouts given  Repeat colonoscopy will be determined by pathology.   

## 2013-03-23 NOTE — Op Note (Signed)
[facility] [address] [place], [address]   COLONOSCOPY PROCEDURE REPORT  PATIENT: Patricia Bullock, Patricia Bullock  MR#: [id number] BIRTHDATE: [birth year] , 67  yrs. old GENDER: Female ENDOSCOPIST: Mardella Layman, MD, [facility] REFERRED BY: PROCEDURE DATE:  03/23/2013 PROCEDURE:   Colonoscopy with snare polypectomy First Screening Colonoscopy - Avg.  risk and is 50 yrs.  old or older - No.      History of Adenoma - Now for follow-up colonoscopy & has been > or = to 3 yrs.  N/A  Polyps Removed Today? Yes. ASA CLASS:   Class II INDICATIONS:Patient's immediate family history of colon cancer. MEDICATIONS: propofol (Diprivan) 200mg  IV  DESCRIPTION OF PROCEDURE:   After the risks benefits and alternatives of the procedure were thoroughly explained, informed consent was obtained.  A digital rectal exam revealed no abnormalities of the rectum.   The LB [id number]  endoscope was introduced through the anus and advanced to the cecum, which was identified by both the appendix and ileocecal valve. No adverse events experienced.   The quality of the prep was excellent, using MoviPrep  The instrument was then slowly withdrawn as the colon was fully examined.      COLON FINDINGS: Mild diverticulosis was noted in the sigmoid colon. Two smooth sessile polyps ranging between 3-6mm in size were found in the sigmoid colon.  A polypectomy was performed with a cold snare.  The resection was complete and the polyp tissue was completely retrieved.  Retroflexed views revealed no abnormalities. The time to cecum=5 minutes 42 seconds.  Withdrawal time=8 minutes 31 seconds.  The scope was withdrawn and the procedure completed. COMPLICATIONS: There were no complications.  ENDOSCOPIC IMPRESSION: 1.   Mild diverticulosis was noted in the sigmoid colon 2.   Two sessile polyps ranging between 3-58mm in size were found in the sigmoid colon; polypectomy was performed with a cold snare...r/o  adenomas.  RECOMMENDATIONS: 1.  Await pathology results 2.  Repeat colonoscopy in 5 years if polyp adenomatous; otherwise 10 years 3.  High fiber diet 4.  Continue current medications   eSigned:  Mardella Layman, MD, [facility] 03/23/2013 11:08 AM   cc: Nelwyn Salisbury, MD   PATIENT NAME:  Patricia Bullock, Patricia Bullock MR#: [id number]

## 2013-03-23 NOTE — Progress Notes (Signed)
Called to room to assist during endoscopic procedure.  Patient ID and intended procedure confirmed with present staff. Received instructions for my participation in the procedure from the performing physician.  

## 2013-03-23 NOTE — Progress Notes (Signed)
Patient did not experience any of the following events: a burn prior to discharge; a fall within the facility; wrong site/side/patient/procedure/implant event; or a hospital transfer or hospital admission upon discharge from the facility. (G8907) Patient did not have preoperative order for IV antibiotic SSI prophylaxis. (G8918)  

## 2013-03-24 ENCOUNTER — Telehealth: Payer: Self-pay

## 2013-03-24 NOTE — Telephone Encounter (Signed)
  Follow up Call-  Call back number 03/23/2013  Post procedure Call Back phone  # [phone]  Permission to leave phone message Yes     Patient questions:  Do you have a fever, pain , or abdominal swelling? no Pain Score  0 *  Have you tolerated food without any problems? yes  Have you been able to return to your normal activities? yes  Do you have any questions about your discharge instructions: Diet   no Medications  no Follow up visit  no  Do you have questions or concerns about your Care? no  Actions: * If pain score is 4 or above: No action needed, pain <4.

## 2013-03-28 ENCOUNTER — Encounter: Payer: Self-pay | Admitting: Gastroenterology

## 2013-04-27 ENCOUNTER — Other Ambulatory Visit: Payer: Self-pay

## 2013-05-23 HISTORY — PX: COLONOSCOPY: SHX174

## 2013-07-31 ENCOUNTER — Telehealth: Payer: Self-pay | Admitting: Family Medicine

## 2013-07-31 NOTE — Telephone Encounter (Signed)
Pt has [facility] and is neededa referral for the following specialist: dr.vaught-Manter ear nose and throat.

## 2013-07-31 NOTE — Telephone Encounter (Signed)
Pt called back to update [place] on the following referrals that she needs:  [place] [facility]-david dasher [phone]  [facility] side OBGYN -Dr. Verlene Mayer [phone]

## 2013-08-01 NOTE — Telephone Encounter (Signed)
I need to know why she sees these doctors (diagnoses)

## 2013-08-02 NOTE — Telephone Encounter (Signed)
I spoke with pt, dermatology for annual screening, past history of mole removals. OBGYN is for annual pap & breast exam.

## 2013-08-03 NOTE — Telephone Encounter (Signed)
I left voice message with below information. 

## 2013-08-03 NOTE — Telephone Encounter (Signed)
Referrals are no longer needed

## 2013-09-01 ENCOUNTER — Other Ambulatory Visit: Payer: Self-pay | Admitting: Family Medicine

## 2013-09-14 ENCOUNTER — Other Ambulatory Visit: Payer: Self-pay | Admitting: Family Medicine

## 2013-09-15 NOTE — Telephone Encounter (Signed)
Pt called [facility] and she does need the below referrals. Call pt when & if we can take care of this?

## 2013-09-18 NOTE — Telephone Encounter (Signed)
Please check on this.

## 2013-11-20 ENCOUNTER — Other Ambulatory Visit: Payer: Self-pay | Admitting: Family Medicine

## 2013-11-23 ENCOUNTER — Other Ambulatory Visit: Payer: Self-pay | Admitting: Family Medicine

## 2013-12-04 ENCOUNTER — Other Ambulatory Visit: Payer: Self-pay | Admitting: Family Medicine

## 2013-12-08 ENCOUNTER — Telehealth: Payer: Self-pay | Admitting: Family Medicine

## 2013-12-08 NOTE — Telephone Encounter (Signed)
I sent a phone note to Dr. Sarajane Jews for Elmo's referral.

## 2013-12-08 NOTE — Telephone Encounter (Signed)
Pt called today about referral for her husband   Patricia Bullock ,  Ear,nose and throat pt was very upset because there is no referral showing in Epic she said she received a call from this office  advising her that the referral was done

## 2013-12-11 ENCOUNTER — Telehealth: Payer: Self-pay | Admitting: Family Medicine

## 2013-12-11 NOTE — Telephone Encounter (Signed)
Pt is needing a referral to see the  ear, nose and throat dr. Frances Nickels ear nose and throat. Pt states she goes every since months for ear wax build up.

## 2013-12-15 ENCOUNTER — Other Ambulatory Visit: Payer: Self-pay | Admitting: Family Medicine

## 2013-12-18 NOTE — Telephone Encounter (Signed)
I spoke with pt's spouse and he said that pt is going to schedule a office visit to see Dr. Sarajane Jews.

## 2013-12-18 NOTE — Telephone Encounter (Signed)
She needs an OV, we have not seen her in well over a year

## 2014-02-06 ENCOUNTER — Ambulatory Visit (INDEPENDENT_AMBULATORY_CARE_PROVIDER_SITE_OTHER): Payer: Commercial Managed Care - HMO | Admitting: Family Medicine

## 2014-02-06 ENCOUNTER — Encounter: Payer: Self-pay | Admitting: Family Medicine

## 2014-02-06 VITALS — BP 158/89 | HR 65 | Temp 98.4°F | Ht 63.75 in | Wt 150.0 lb

## 2014-02-06 DIAGNOSIS — E785 Hyperlipidemia, unspecified: Secondary | ICD-10-CM

## 2014-02-06 DIAGNOSIS — H6123 Impacted cerumen, bilateral: Secondary | ICD-10-CM

## 2014-02-06 DIAGNOSIS — Z0000 Encounter for general adult medical examination without abnormal findings: Secondary | ICD-10-CM

## 2014-02-06 DIAGNOSIS — H6120 Impacted cerumen, unspecified ear: Secondary | ICD-10-CM

## 2014-02-06 LAB — BASIC METABOLIC PANEL
BUN: 15 mg/dL (ref 6–23)
CO2: 28 mEq/L (ref 19–32)
Calcium: 9.3 mg/dL (ref 8.4–10.5)
Chloride: 105 mEq/L (ref 96–112)
Creatinine, Ser: 0.8 mg/dL (ref 0.4–1.2)
GFR: 74.12 mL/min (ref >60.00)
Glucose, Bld: 102 mg/dL — ABNORMAL HIGH (ref 70–99)
Potassium: 4 mEq/L (ref 3.5–5.1)
Sodium: 141 mEq/L (ref 135–145)

## 2014-02-06 LAB — HEPATIC FUNCTION PANEL
ALBUMIN: 4 g/dL (ref 3.5–5.2)
ALT: 20 U/L (ref 0–35)
AST: 27 U/L (ref 0–37)
Alkaline Phosphatase: 88 U/L (ref 39–117)
Bilirubin, Direct: 0 mg/dL (ref 0.0–0.3)
TOTAL PROTEIN: 7 g/dL (ref 6.0–8.3)
Total Bilirubin: 0.7 mg/dL (ref 0.2–1.2)

## 2014-02-06 LAB — CBC WITH DIFFERENTIAL/PLATELET
BASOS PCT: 0.6 % (ref 0.0–3.0)
Basophils Absolute: 0 10*3/uL (ref 0.0–0.1)
EOS ABS: 0.2 10*3/uL (ref 0.0–0.7)
EOS PCT: 3.8 % (ref 0.0–5.0)
HCT: 39.2 % (ref 36.0–46.0)
Hemoglobin: 13.2 g/dL (ref 12.0–15.0)
LYMPHS ABS: 1.4 10*3/uL (ref 0.7–4.0)
Lymphocytes Relative: 30.5 % (ref 12.0–46.0)
MCHC: 33.8 g/dL (ref 30.0–36.0)
MCV: 88.6 fl (ref 78.0–100.0)
MONO ABS: 0.3 10*3/uL (ref 0.1–1.0)
Monocytes Relative: 7.1 % (ref 3.0–12.0)
NEUTROS PCT: 58 % (ref 43.0–77.0)
Neutro Abs: 2.7 10*3/uL (ref 1.4–7.7)
PLATELETS: 238 10*3/uL (ref 150.0–400.0)
RBC: 4.42 Mil/uL (ref 3.87–5.11)
RDW: 14.2 % (ref 11.5–15.5)
WBC: 4.7 10*3/uL (ref 4.0–10.5)

## 2014-02-06 LAB — POCT URINALYSIS DIPSTICK
Bilirubin, UA: NEGATIVE
Blood, UA: NEGATIVE
Glucose, UA: NEGATIVE
Ketones, UA: NEGATIVE
Leukocytes, UA: NEGATIVE
Nitrite, UA: NEGATIVE
Protein, UA: NEGATIVE
Spec Grav, UA: 1.01
Urobilinogen, UA: 0.2
pH, UA: 5.5

## 2014-02-06 LAB — TSH: TSH: 2.57 u[IU]/mL (ref 0.35–4.50)

## 2014-02-06 LAB — LIPID PANEL
CHOLESTEROL: 304 mg/dL — AB (ref 0–200)
HDL: 53 mg/dL (ref >39.00)
LDL CALC: 222 mg/dL — AB (ref 0–99)
NONHDL: 251
Total CHOL/HDL Ratio: 6
Triglycerides: 143 mg/dL (ref 0.0–149.0)
VLDL: 28.6 mg/dL (ref 0.0–40.0)

## 2014-02-06 MED ORDER — SUMATRIPTAN SUCCINATE 100 MG PO TABS: 100.0000 mg | ORAL_TABLET | ORAL | Status: DC | PRN

## 2014-02-06 MED ORDER — CLONAZEPAM 0.5 MG PO TABS: 0.5000 mg | ORAL_TABLET | Freq: Three times a day (TID) | ORAL | Status: DC | PRN

## 2014-02-06 MED ORDER — PROPRANOLOL HCL 40 MG PO TABS: ORAL_TABLET | ORAL | Status: DC

## 2014-02-06 MED ORDER — MONTELUKAST SODIUM 10 MG PO TABS: ORAL_TABLET | ORAL | Status: DC

## 2014-02-06 MED ORDER — LOSARTAN POTASSIUM 100 MG PO TABS: ORAL_TABLET | ORAL | Status: DC

## 2014-02-06 NOTE — Progress Notes (Signed)
   Subjective:    Patient ID: Patricia Bullock, female    DOB: 1946-06-02, 68 y.o.   MRN: [id number]  HPI 68 yr old female for a cpx. She feels well. She runs on her treadmill twice every day.    Review of Systems  Constitutional: Negative.   HENT: Negative.   Eyes: Negative.   Respiratory: Negative.   Cardiovascular: Negative.   Gastrointestinal: Negative.   Genitourinary: Negative for dysuria, urgency, frequency, hematuria, flank pain, decreased urine volume, enuresis, difficulty urinating, pelvic pain and dyspareunia.  Musculoskeletal: Negative.   Skin: Negative.   Neurological: Negative.   Psychiatric/Behavioral: Negative.        Objective:   Physical Exam  Constitutional: She is oriented to person, place, and time. She appears well-developed and well-nourished. No distress.  HENT:  Head: Normocephalic and atraumatic.  Right Ear: External ear normal.  Left Ear: External ear normal.  Nose: Nose normal.  Mouth/Throat: Oropharynx is clear and moist. No oropharyngeal exudate.  Eyes: Conjunctivae and EOM are normal. Pupils are equal, round, and reactive to light. No scleral icterus.  Neck: Normal range of motion. Neck supple. No JVD present. No thyromegaly present.  Cardiovascular: Normal rate, regular rhythm, normal heart sounds and intact distal pulses.  Exam reveals no gallop and no friction rub.   No murmur heard. EKG normal  Pulmonary/Chest: Effort normal and breath sounds normal. No respiratory distress. She has no wheezes. She has no rales. She exhibits no tenderness.  Abdominal: Soft. Bowel sounds are normal. She exhibits no distension and no mass. There is no tenderness. There is no rebound and no guarding.  Musculoskeletal: Normal range of motion. She exhibits no edema and no tenderness.  Lymphadenopathy:    She has no cervical adenopathy.  Neurological: She is alert and oriented to person, place, and time. She has normal reflexes. No cranial nerve deficit. She exhibits  normal muscle tone. Coordination normal.  Skin: Skin is warm and dry. No rash noted. No erythema.  Psychiatric: She has a normal mood and affect. Her behavior is normal. Judgment and thought content normal.          Assessment & Plan:  Well exam. Get fasting labs.

## 2014-02-06 NOTE — Progress Notes (Signed)
Pre visit review using our clinic review tool, if applicable. No additional management support is needed unless otherwise documented below in the visit note. 

## 2014-03-05 ENCOUNTER — Other Ambulatory Visit: Payer: Self-pay | Admitting: Family Medicine

## 2014-06-04 ENCOUNTER — Ambulatory Visit: Payer: Self-pay | Admitting: Otolaryngology

## 2014-06-07 ENCOUNTER — Encounter: Payer: Self-pay | Admitting: Family Medicine

## 2014-12-11 ENCOUNTER — Other Ambulatory Visit: Payer: Self-pay | Admitting: Family Medicine

## 2015-01-07 ENCOUNTER — Other Ambulatory Visit: Payer: Self-pay | Admitting: Family Medicine

## 2015-01-28 ENCOUNTER — Other Ambulatory Visit: Payer: Self-pay | Admitting: Family Medicine

## 2015-03-13 ENCOUNTER — Other Ambulatory Visit: Payer: Self-pay | Admitting: Family Medicine

## 2015-03-27 ENCOUNTER — Encounter: Payer: Self-pay | Admitting: Family Medicine

## 2015-03-27 ENCOUNTER — Ambulatory Visit (INDEPENDENT_AMBULATORY_CARE_PROVIDER_SITE_OTHER): Payer: PPO | Admitting: Family Medicine

## 2015-03-27 VITALS — BP 157/92 | HR 65 | Temp 98.3°F | Ht 63.75 in | Wt 154.0 lb

## 2015-03-27 DIAGNOSIS — Z0000 Encounter for general adult medical examination without abnormal findings: Secondary | ICD-10-CM | POA: Diagnosis not present

## 2015-03-27 DIAGNOSIS — I10 Essential (primary) hypertension: Secondary | ICD-10-CM | POA: Diagnosis not present

## 2015-03-27 LAB — POCT URINALYSIS DIPSTICK
BILIRUBIN UA: NEGATIVE
GLUCOSE UA: NEGATIVE
KETONES UA: NEGATIVE
Leukocytes, UA: NEGATIVE
Nitrite, UA: NEGATIVE
Protein, UA: NEGATIVE
RBC UA: NEGATIVE
SPEC GRAV UA: 1.02
UROBILINOGEN UA: 0.2
pH, UA: 5.5

## 2015-03-27 LAB — CBC WITH DIFFERENTIAL/PLATELET
BASOS PCT: 0.5 % (ref 0.0–3.0)
Basophils Absolute: 0 10*3/uL (ref 0.0–0.1)
EOS PCT: 4.1 % (ref 0.0–5.0)
Eosinophils Absolute: 0.2 10*3/uL (ref 0.0–0.7)
HCT: 41.8 % (ref 36.0–46.0)
Hemoglobin: 13.7 g/dL (ref 12.0–15.0)
LYMPHS ABS: 1.2 10*3/uL (ref 0.7–4.0)
Lymphocytes Relative: 30 % (ref 12.0–46.0)
MCHC: 32.7 g/dL (ref 30.0–36.0)
MCV: 89.6 fl (ref 78.0–100.0)
MONO ABS: 0.3 10*3/uL (ref 0.1–1.0)
MONOS PCT: 7.4 % (ref 3.0–12.0)
NEUTROS ABS: 2.4 10*3/uL (ref 1.4–7.7)
NEUTROS PCT: 58 % (ref 43.0–77.0)
Platelets: 215 10*3/uL (ref 150.0–400.0)
RBC: 4.67 Mil/uL (ref 3.87–5.11)
RDW: 14.4 % (ref 11.5–15.5)
WBC: 4.1 10*3/uL (ref 4.0–10.5)

## 2015-03-27 LAB — HEPATIC FUNCTION PANEL
ALT: 19 U/L (ref 0–35)
AST: 21 U/L (ref 0–37)
Albumin: 4.1 g/dL (ref 3.5–5.2)
Alkaline Phosphatase: 89 U/L (ref 39–117)
BILIRUBIN DIRECT: 0.1 mg/dL (ref 0.0–0.3)
BILIRUBIN TOTAL: 0.6 mg/dL (ref 0.2–1.2)
TOTAL PROTEIN: 6.9 g/dL (ref 6.0–8.3)

## 2015-03-27 LAB — BASIC METABOLIC PANEL
BUN: 22 mg/dL (ref 6–23)
CHLORIDE: 104 meq/L (ref 96–112)
CO2: 29 meq/L (ref 19–32)
CREATININE: 0.86 mg/dL (ref 0.40–1.20)
Calcium: 9.6 mg/dL (ref 8.4–10.5)
GFR: 69.91 mL/min (ref >60.00)
Glucose, Bld: 99 mg/dL (ref 70–99)
POTASSIUM: 3.9 meq/L (ref 3.5–5.1)
Sodium: 142 mEq/L (ref 135–145)

## 2015-03-27 LAB — LIPID PANEL
CHOL/HDL RATIO: 5
Cholesterol: 275 mg/dL — ABNORMAL HIGH (ref 0–200)
HDL: 57.4 mg/dL (ref >39.00)
LDL Cholesterol: 191 mg/dL — ABNORMAL HIGH (ref 0–99)
NONHDL: 217.14
TRIGLYCERIDES: 129 mg/dL (ref 0.0–149.0)
VLDL: 25.8 mg/dL (ref 0.0–40.0)

## 2015-03-27 LAB — TSH: TSH: 3.99 u[IU]/mL (ref 0.35–4.50)

## 2015-03-27 MED ORDER — LOSARTAN POTASSIUM 100 MG PO TABS: 50.0000 mg | ORAL_TABLET | Freq: Every day | ORAL | Status: DC

## 2015-03-27 NOTE — Progress Notes (Signed)
   Subjective:    Patient ID: Patricia Bullock, female    DOB: 10-22-1945, 69 y.o.   MRN: [id number]  HPI 69 yr old female for a cpx. She feels well and has no complaints. She exercises twice a day every day. Her BP at home is well controlled but often runs a little low. She averages in the 110s over 70s, but sometimes drops to 100-110 over 60s.    Review of Systems  Constitutional: Negative.   HENT: Negative.   Eyes: Negative.   Respiratory: Negative.   Cardiovascular: Negative.   Gastrointestinal: Negative.   Genitourinary: Negative for dysuria, urgency, frequency, hematuria, flank pain, decreased urine volume, enuresis, difficulty urinating, pelvic pain and dyspareunia.  Musculoskeletal: Negative.   Skin: Negative.   Neurological: Negative.   Psychiatric/Behavioral: Negative.        Objective:   Physical Exam  Constitutional: She is oriented to person, place, and time. She appears well-developed and well-nourished. No distress.  HENT:  Head: Normocephalic and atraumatic.  Right Ear: External ear normal.  Left Ear: External ear normal.  Nose: Nose normal.  Mouth/Throat: Oropharynx is clear and moist. No oropharyngeal exudate.  Eyes: Conjunctivae and EOM are normal. Pupils are equal, round, and reactive to light. No scleral icterus.  Neck: Normal range of motion. Neck supple. No JVD present. No thyromegaly present.  Cardiovascular: Normal rate, regular rhythm, normal heart sounds and intact distal pulses.  Exam reveals no gallop and no friction rub.   No murmur heard. EKG normal   Pulmonary/Chest: Effort normal and breath sounds normal. No respiratory distress. She has no wheezes. She has no rales. She exhibits no tenderness.  Abdominal: Soft. Bowel sounds are normal. She exhibits no distension and no mass. There is no tenderness. There is no rebound and no guarding.  Musculoskeletal: Normal range of motion. She exhibits no edema or tenderness.  Lymphadenopathy:    She has no  cervical adenopathy.  Neurological: She is alert and oriented to person, place, and time. She has normal reflexes. No cranial nerve deficit. She exhibits normal muscle tone. Coordination normal.  Skin: Skin is warm and dry. No rash noted. No erythema.  Psychiatric: She has a normal mood and affect. Her behavior is normal. Judgment and thought content normal.          Assessment & Plan:  Well exam. Get fasting labs. Her HTN may be overly controlled so she will decrease the Losartan to 1/2 a tablet a day (50 mg). We discussed dietary advice also.

## 2015-03-27 NOTE — Progress Notes (Signed)
Pre visit review using our clinic review tool, if applicable. No additional management support is needed unless otherwise documented below in the visit note. 

## 2015-04-10 ENCOUNTER — Other Ambulatory Visit: Payer: Self-pay | Admitting: Family Medicine

## 2015-04-30 ENCOUNTER — Other Ambulatory Visit: Payer: Self-pay | Admitting: Family Medicine

## 2015-05-10 LAB — HM MAMMOGRAPHY

## 2015-05-21 ENCOUNTER — Encounter: Payer: Self-pay | Admitting: Family Medicine

## 2015-07-15 ENCOUNTER — Other Ambulatory Visit: Payer: Self-pay | Admitting: Family Medicine

## 2015-08-02 ENCOUNTER — Other Ambulatory Visit: Payer: Self-pay | Admitting: Family Medicine

## 2015-08-15 ENCOUNTER — Other Ambulatory Visit: Payer: Self-pay | Admitting: Family Medicine

## 2015-10-03 DIAGNOSIS — D225 Melanocytic nevi of trunk: Secondary | ICD-10-CM | POA: Diagnosis not present

## 2015-10-03 DIAGNOSIS — D2272 Melanocytic nevi of left lower limb, including hip: Secondary | ICD-10-CM | POA: Diagnosis not present

## 2015-10-03 DIAGNOSIS — D2262 Melanocytic nevi of left upper limb, including shoulder: Secondary | ICD-10-CM | POA: Diagnosis not present

## 2015-10-03 DIAGNOSIS — D2261 Melanocytic nevi of right upper limb, including shoulder: Secondary | ICD-10-CM | POA: Diagnosis not present

## 2015-10-03 DIAGNOSIS — D485 Neoplasm of uncertain behavior of skin: Secondary | ICD-10-CM | POA: Diagnosis not present

## 2015-10-03 DIAGNOSIS — D2339 Other benign neoplasm of skin of other parts of face: Secondary | ICD-10-CM | POA: Diagnosis not present

## 2015-11-15 DIAGNOSIS — R05 Cough: Secondary | ICD-10-CM | POA: Diagnosis not present

## 2015-11-15 DIAGNOSIS — H6123 Impacted cerumen, bilateral: Secondary | ICD-10-CM | POA: Diagnosis not present

## 2015-11-25 ENCOUNTER — Other Ambulatory Visit: Payer: Self-pay | Admitting: Family Medicine

## 2016-01-27 ENCOUNTER — Other Ambulatory Visit: Payer: Self-pay | Admitting: Family Medicine

## 2016-02-04 ENCOUNTER — Other Ambulatory Visit: Payer: Self-pay | Admitting: Family Medicine

## 2016-02-10 ENCOUNTER — Other Ambulatory Visit: Payer: Self-pay | Admitting: Otolaryngology

## 2016-02-10 ENCOUNTER — Ambulatory Visit
Admission: RE | Admit: 2016-02-10 | Discharge: 2016-02-10 | Disposition: A | Discharge status: Home / Self Care | Payer: PPO | Source: Ambulatory Visit | Attending: Otolaryngology | Admitting: Otolaryngology

## 2016-02-10 DIAGNOSIS — J301 Allergic rhinitis due to pollen: Secondary | ICD-10-CM | POA: Diagnosis not present

## 2016-02-10 DIAGNOSIS — K219 Gastro-esophageal reflux disease without esophagitis: Secondary | ICD-10-CM | POA: Diagnosis not present

## 2016-02-10 DIAGNOSIS — R05 Cough: Secondary | ICD-10-CM | POA: Diagnosis not present

## 2016-02-10 DIAGNOSIS — I700 Atherosclerosis of aorta: Secondary | ICD-10-CM | POA: Diagnosis not present

## 2016-02-10 DIAGNOSIS — R059 Cough, unspecified: Secondary | ICD-10-CM

## 2016-02-18 DIAGNOSIS — J301 Allergic rhinitis due to pollen: Secondary | ICD-10-CM | POA: Diagnosis not present

## 2016-03-06 ENCOUNTER — Other Ambulatory Visit: Payer: Self-pay | Admitting: Family Medicine

## 2016-03-06 NOTE — Telephone Encounter (Signed)
Rx refill sent to pharmacy. 

## 2016-03-16 DIAGNOSIS — R05 Cough: Secondary | ICD-10-CM | POA: Diagnosis not present

## 2016-03-16 DIAGNOSIS — J301 Allergic rhinitis due to pollen: Secondary | ICD-10-CM | POA: Diagnosis not present

## 2016-03-16 DIAGNOSIS — H6980 Other specified disorders of Eustachian tube, unspecified ear: Secondary | ICD-10-CM | POA: Diagnosis not present

## 2016-03-16 DIAGNOSIS — R0981 Nasal congestion: Secondary | ICD-10-CM | POA: Diagnosis not present

## 2016-03-18 ENCOUNTER — Telehealth: Payer: Self-pay | Admitting: Family Medicine

## 2016-03-18 ENCOUNTER — Other Ambulatory Visit: Payer: Self-pay | Admitting: Family Medicine

## 2016-03-18 MED ORDER — HYDROCODONE-HOMATROPINE 5-1.5 MG/5ML PO SYRP: 5.0000 mL | ORAL_SOLUTION | ORAL | 0 refills | Status: DC | PRN

## 2016-03-18 NOTE — Telephone Encounter (Signed)
done

## 2016-03-18 NOTE — Telephone Encounter (Signed)
Pt going on vacation out of the country tomorrow and has a cough. Would like dr fry to refill cough syrup so she doesn't cough the whole time on the plane.

## 2016-03-19 ENCOUNTER — Other Ambulatory Visit: Payer: Self-pay | Admitting: Family Medicine

## 2016-03-19 NOTE — Telephone Encounter (Signed)
Script is ready for pick up and pt is here now.

## 2016-04-06 ENCOUNTER — Encounter: Payer: Self-pay | Admitting: Family Medicine

## 2016-04-06 ENCOUNTER — Ambulatory Visit (INDEPENDENT_AMBULATORY_CARE_PROVIDER_SITE_OTHER): Payer: PPO | Admitting: Family Medicine

## 2016-04-06 VITALS — BP 168/100 | HR 64 | Temp 98.2°F | Ht 63.75 in | Wt 146.0 lb

## 2016-04-06 DIAGNOSIS — Z0000 Encounter for general adult medical examination without abnormal findings: Secondary | ICD-10-CM | POA: Diagnosis not present

## 2016-04-06 DIAGNOSIS — E785 Hyperlipidemia, unspecified: Secondary | ICD-10-CM | POA: Diagnosis not present

## 2016-04-06 LAB — CBC WITH DIFFERENTIAL/PLATELET
BASOS PCT: 0.7 % (ref 0.0–3.0)
Basophils Absolute: 0 10*3/uL (ref 0.0–0.1)
EOS PCT: 3.9 % (ref 0.0–5.0)
Eosinophils Absolute: 0.2 10*3/uL (ref 0.0–0.7)
HEMATOCRIT: 39.7 % (ref 36.0–46.0)
HEMOGLOBIN: 13.4 g/dL (ref 12.0–15.0)
LYMPHS PCT: 27.4 % (ref 12.0–46.0)
Lymphs Abs: 1.2 10*3/uL (ref 0.7–4.0)
MCHC: 33.7 g/dL (ref 30.0–36.0)
MCV: 88.8 fl (ref 78.0–100.0)
MONO ABS: 0.3 10*3/uL (ref 0.1–1.0)
MONOS PCT: 8.1 % (ref 3.0–12.0)
Neutro Abs: 2.5 10*3/uL (ref 1.4–7.7)
Neutrophils Relative %: 59.9 % (ref 43.0–77.0)
Platelets: 248 10*3/uL (ref 150.0–400.0)
RBC: 4.47 Mil/uL (ref 3.87–5.11)
RDW: 14.1 % (ref 11.5–15.5)
WBC: 4.2 10*3/uL (ref 4.0–10.5)

## 2016-04-06 LAB — HEPATIC FUNCTION PANEL
ALK PHOS: 83 U/L (ref 39–117)
ALT: 19 U/L (ref 0–35)
AST: 22 U/L (ref 0–37)
Albumin: 4.4 g/dL (ref 3.5–5.2)
BILIRUBIN DIRECT: 0.1 mg/dL (ref 0.0–0.3)
BILIRUBIN TOTAL: 0.5 mg/dL (ref 0.2–1.2)
TOTAL PROTEIN: 7.1 g/dL (ref 6.0–8.3)

## 2016-04-06 LAB — POC URINALSYSI DIPSTICK (AUTOMATED)
Bilirubin, UA: NEGATIVE
Blood, UA: NEGATIVE
GLUCOSE UA: NEGATIVE
Ketones, UA: NEGATIVE
LEUKOCYTES UA: NEGATIVE
NITRITE UA: NEGATIVE
PROTEIN UA: NEGATIVE
SPEC GRAV UA: 1.01
UROBILINOGEN UA: 0.2
pH, UA: 5.5

## 2016-04-06 LAB — BASIC METABOLIC PANEL
BUN: 13 mg/dL (ref 6–23)
CALCIUM: 9.2 mg/dL (ref 8.4–10.5)
CO2: 31 meq/L (ref 19–32)
CREATININE: 0.81 mg/dL (ref 0.40–1.20)
Chloride: 105 mEq/L (ref 96–112)
GFR: 74.68 mL/min (ref >60.00)
Glucose, Bld: 91 mg/dL (ref 70–99)
Potassium: 4 mEq/L (ref 3.5–5.1)
SODIUM: 144 meq/L (ref 135–145)

## 2016-04-06 LAB — LIPID PANEL
CHOL/HDL RATIO: 4
Cholesterol: 284 mg/dL — ABNORMAL HIGH (ref 0–200)
HDL: 69.3 mg/dL (ref >39.00)
LDL Cholesterol: 195 mg/dL — ABNORMAL HIGH (ref 0–99)
NONHDL: 215.15
Triglycerides: 101 mg/dL (ref 0.0–149.0)
VLDL: 20.2 mg/dL (ref 0.0–40.0)

## 2016-04-06 LAB — TSH: TSH: 3.91 u[IU]/mL (ref 0.35–4.50)

## 2016-04-06 MED ORDER — CLONAZEPAM 0.5 MG PO TABS: 0.5000 mg | ORAL_TABLET | Freq: Three times a day (TID) | ORAL | 0 refills | Status: DC | PRN

## 2016-04-06 NOTE — Progress Notes (Signed)
   Subjective:    Patient ID: Patricia Bullock, female    DOB: 11-Sep-1945, 70 y.o.   MRN: [id number]  HPI 70 yr old female for a well exam. She feels fine. She exercises twice a day every day. Her BP at home has actually been low lately, with systolic readings in the 123456 range. She has not had a panic attack in over 2 years and she has not had a migraine in over 5 years.    Review of Systems  Constitutional: Negative.   HENT: Negative.   Eyes: Negative.   Respiratory: Negative.   Cardiovascular: Negative.   Gastrointestinal: Negative.   Genitourinary: Negative for decreased urine volume, difficulty urinating, dyspareunia, dysuria, enuresis, flank pain, frequency, hematuria, pelvic pain and urgency.  Musculoskeletal: Negative.   Skin: Negative.   Neurological: Negative.   Psychiatric/Behavioral: Negative.        Objective:   Physical Exam  Constitutional: She is oriented to person, place, and time. She appears well-developed and well-nourished. No distress.  HENT:  Head: Normocephalic and atraumatic.  Right Ear: External ear normal.  Left Ear: External ear normal.  Nose: Nose normal.  Mouth/Throat: Oropharynx is clear and moist. No oropharyngeal exudate.  Eyes: Conjunctivae and EOM are normal. Pupils are equal, round, and reactive to light. No scleral icterus.  Neck: Normal range of motion. Neck supple. No JVD present. No thyromegaly present.  Cardiovascular: Normal rate, regular rhythm, normal heart sounds and intact distal pulses.  Exam reveals no gallop and no friction rub.   No murmur heard. Pulmonary/Chest: Effort normal and breath sounds normal. No respiratory distress. She has no wheezes. She has no rales. She exhibits no tenderness.  Abdominal: Soft. Bowel sounds are normal. She exhibits no distension and no mass. There is no tenderness. There is no rebound and no guarding.  Musculoskeletal: Normal range of motion. She exhibits no edema or tenderness.  Lymphadenopathy:    She has no cervical adenopathy.  Neurological: She is alert and oriented to person, place, and time. She has normal reflexes. No cranial nerve deficit. She exhibits normal muscle tone. Coordination normal.  Skin: Skin is warm and dry. No rash noted. No erythema.  Psychiatric: She has a normal mood and affect. Her behavior is normal. Judgment and thought content normal.          Assessment & Plan:  Well exam. We discussed diet and exercise. Get fasting labs. We agreed to stop the Losartan and to stay on Propranolol alone.  Laurey Morale, MD

## 2016-04-06 NOTE — Progress Notes (Signed)
Pre visit review using our clinic review tool, if applicable. No additional management support is needed unless otherwise documented below in the visit note. 

## 2016-05-06 ENCOUNTER — Other Ambulatory Visit: Payer: Self-pay | Admitting: Family Medicine

## 2016-05-06 DIAGNOSIS — Z1231 Encounter for screening mammogram for malignant neoplasm of breast: Secondary | ICD-10-CM

## 2016-06-03 ENCOUNTER — Other Ambulatory Visit: Payer: Self-pay | Admitting: Family Medicine

## 2016-06-16 ENCOUNTER — Ambulatory Visit
Admission: RE | Admit: 2016-06-16 | Discharge: 2016-06-16 | Disposition: A | Discharge status: Home / Self Care | Payer: PPO | Source: Ambulatory Visit | Attending: Family Medicine | Admitting: Family Medicine

## 2016-06-16 DIAGNOSIS — Z1231 Encounter for screening mammogram for malignant neoplasm of breast: Secondary | ICD-10-CM | POA: Diagnosis not present

## 2016-07-13 ENCOUNTER — Other Ambulatory Visit: Payer: Self-pay | Admitting: Family Medicine

## 2016-07-14 ENCOUNTER — Encounter: Payer: Self-pay | Admitting: Family Medicine

## 2016-07-14 ENCOUNTER — Other Ambulatory Visit: Payer: Self-pay | Admitting: Family Medicine

## 2016-07-14 ENCOUNTER — Telehealth: Payer: Self-pay | Admitting: Family Medicine

## 2016-07-14 DIAGNOSIS — Z1239 Encounter for other screening for malignant neoplasm of breast: Secondary | ICD-10-CM | POA: Diagnosis not present

## 2016-07-14 DIAGNOSIS — R05 Cough: Secondary | ICD-10-CM | POA: Diagnosis not present

## 2016-07-14 DIAGNOSIS — Z0000 Encounter for general adult medical examination without abnormal findings: Secondary | ICD-10-CM | POA: Diagnosis not present

## 2016-07-14 DIAGNOSIS — Z13820 Encounter for screening for osteoporosis: Secondary | ICD-10-CM | POA: Diagnosis not present

## 2016-07-14 DIAGNOSIS — J300 Vasomotor rhinitis: Secondary | ICD-10-CM | POA: Diagnosis not present

## 2016-07-14 NOTE — Telephone Encounter (Signed)
Pt would like to resume taking  losartan (COZAAR) 100 MG tablet  Pt had started to come off of this, and was taking 1/2 tab.  But states when she went totally off, her bp started rising again.  Also , pt not exercising as much.  [facility]/ , [place]

## 2016-07-14 NOTE — Telephone Encounter (Signed)
Do not see on current medication list? 

## 2016-07-15 MED ORDER — LOSARTAN POTASSIUM 50 MG PO TABS: 50.0000 mg | ORAL_TABLET | Freq: Every day | ORAL | 3 refills | Status: DC

## 2016-07-15 NOTE — Telephone Encounter (Signed)
I sent script e-scribe to [facility] and left a voice message with this information, also sent a my chart message.

## 2016-07-15 NOTE — Telephone Encounter (Signed)
Lets get back on Losartan at the 50 mg dose. Take this daily. Call in #90 with 3 rf

## 2016-10-02 DIAGNOSIS — D2271 Melanocytic nevi of right lower limb, including hip: Secondary | ICD-10-CM | POA: Diagnosis not present

## 2016-10-02 DIAGNOSIS — D2262 Melanocytic nevi of left upper limb, including shoulder: Secondary | ICD-10-CM | POA: Diagnosis not present

## 2016-10-02 DIAGNOSIS — X32XXXA Exposure to sunlight, initial encounter: Secondary | ICD-10-CM | POA: Diagnosis not present

## 2016-10-02 DIAGNOSIS — L570 Actinic keratosis: Secondary | ICD-10-CM | POA: Diagnosis not present

## 2016-10-02 DIAGNOSIS — D225 Melanocytic nevi of trunk: Secondary | ICD-10-CM | POA: Diagnosis not present

## 2016-10-02 DIAGNOSIS — Z872 Personal history of diseases of the skin and subcutaneous tissue: Secondary | ICD-10-CM | POA: Diagnosis not present

## 2016-10-21 DIAGNOSIS — H6123 Impacted cerumen, bilateral: Secondary | ICD-10-CM | POA: Diagnosis not present

## 2016-10-21 DIAGNOSIS — H6063 Unspecified chronic otitis externa, bilateral: Secondary | ICD-10-CM | POA: Diagnosis not present

## 2016-10-26 ENCOUNTER — Other Ambulatory Visit: Payer: Self-pay | Admitting: Family Medicine

## 2016-10-27 NOTE — Telephone Encounter (Signed)
Call in #90 with 2 rf 

## 2016-12-03 ENCOUNTER — Other Ambulatory Visit: Payer: Self-pay | Admitting: Family Medicine

## 2017-01-12 DIAGNOSIS — H00022 Hordeolum internum right lower eyelid: Secondary | ICD-10-CM | POA: Diagnosis not present

## 2017-01-26 DIAGNOSIS — J300 Vasomotor rhinitis: Secondary | ICD-10-CM | POA: Diagnosis not present

## 2017-01-26 DIAGNOSIS — R05 Cough: Secondary | ICD-10-CM | POA: Diagnosis not present

## 2017-02-23 DIAGNOSIS — L728 Other follicular cysts of the skin and subcutaneous tissue: Secondary | ICD-10-CM | POA: Diagnosis not present

## 2017-03-11 ENCOUNTER — Encounter: Payer: Self-pay | Admitting: Family Medicine

## 2017-03-29 DIAGNOSIS — L728 Other follicular cysts of the skin and subcutaneous tissue: Secondary | ICD-10-CM | POA: Diagnosis not present

## 2017-04-09 ENCOUNTER — Other Ambulatory Visit: Payer: Self-pay | Admitting: Family Medicine

## 2017-05-07 DIAGNOSIS — H6062 Unspecified chronic otitis externa, left ear: Secondary | ICD-10-CM | POA: Diagnosis not present

## 2017-05-07 DIAGNOSIS — H6123 Impacted cerumen, bilateral: Secondary | ICD-10-CM | POA: Diagnosis not present

## 2017-05-11 ENCOUNTER — Other Ambulatory Visit: Payer: Self-pay | Admitting: Obstetrics and Gynecology

## 2017-05-11 ENCOUNTER — Other Ambulatory Visit: Payer: Self-pay | Admitting: Family Medicine

## 2017-05-11 DIAGNOSIS — Z1231 Encounter for screening mammogram for malignant neoplasm of breast: Secondary | ICD-10-CM

## 2017-05-25 DIAGNOSIS — L728 Other follicular cysts of the skin and subcutaneous tissue: Secondary | ICD-10-CM | POA: Diagnosis not present

## 2017-06-08 ENCOUNTER — Ambulatory Visit (INDEPENDENT_AMBULATORY_CARE_PROVIDER_SITE_OTHER): Payer: PPO | Admitting: Family Medicine

## 2017-06-08 ENCOUNTER — Encounter: Payer: Self-pay | Admitting: Family Medicine

## 2017-06-08 VITALS — BP 140/80 | HR 77 | Temp 98.2°F | Ht 63.25 in | Wt 161.6 lb

## 2017-06-08 DIAGNOSIS — G451 Carotid artery syndrome (hemispheric): Secondary | ICD-10-CM

## 2017-06-08 DIAGNOSIS — Z0000 Encounter for general adult medical examination without abnormal findings: Secondary | ICD-10-CM | POA: Diagnosis not present

## 2017-06-08 LAB — BASIC METABOLIC PANEL
BUN: 14 mg/dL (ref 6–23)
CALCIUM: 9.2 mg/dL (ref 8.4–10.5)
CO2: 31 mEq/L (ref 19–32)
CREATININE: 0.74 mg/dL (ref 0.40–1.20)
Chloride: 105 mEq/L (ref 96–112)
GFR: 82.61 mL/min (ref >60.00)
GLUCOSE: 100 mg/dL — AB (ref 70–99)
Potassium: 4.1 mEq/L (ref 3.5–5.1)
Sodium: 143 mEq/L (ref 135–145)

## 2017-06-08 LAB — HEPATIC FUNCTION PANEL
ALBUMIN: 4.1 g/dL (ref 3.5–5.2)
ALT: 16 U/L (ref 0–35)
AST: 20 U/L (ref 0–37)
Alkaline Phosphatase: 84 U/L (ref 39–117)
BILIRUBIN TOTAL: 0.6 mg/dL (ref 0.2–1.2)
Bilirubin, Direct: 0.1 mg/dL (ref 0.0–0.3)
Total Protein: 6.4 g/dL (ref 6.0–8.3)

## 2017-06-08 LAB — POC URINALSYSI DIPSTICK (AUTOMATED)
Bilirubin, UA: NEGATIVE
GLUCOSE UA: NEGATIVE
KETONES UA: NEGATIVE
Leukocytes, UA: NEGATIVE
Nitrite, UA: NEGATIVE
Protein, UA: NEGATIVE
RBC UA: NEGATIVE
SPEC GRAV UA: 1.015 (ref 1.010–1.025)
Urobilinogen, UA: 0.2 E.U./dL
pH, UA: 6 (ref 5.0–8.0)

## 2017-06-08 LAB — CBC WITH DIFFERENTIAL/PLATELET
BASOS PCT: 1.1 % (ref 0.0–3.0)
Basophils Absolute: 0 10*3/uL (ref 0.0–0.1)
EOS PCT: 5.5 % — AB (ref 0.0–5.0)
Eosinophils Absolute: 0.2 10*3/uL (ref 0.0–0.7)
HCT: 40.6 % (ref 36.0–46.0)
HEMOGLOBIN: 13.2 g/dL (ref 12.0–15.0)
LYMPHS ABS: 1 10*3/uL (ref 0.7–4.0)
Lymphocytes Relative: 35.2 % (ref 12.0–46.0)
MCHC: 32.6 g/dL (ref 30.0–36.0)
MCV: 90.2 fl (ref 78.0–100.0)
MONO ABS: 0.2 10*3/uL (ref 0.1–1.0)
Monocytes Relative: 8.3 % (ref 3.0–12.0)
NEUTROS ABS: 1.5 10*3/uL (ref 1.4–7.7)
NEUTROS PCT: 49.9 % (ref 43.0–77.0)
PLATELETS: 210 10*3/uL (ref 150.0–400.0)
RBC: 4.5 Mil/uL (ref 3.87–5.11)
RDW: 14.1 % (ref 11.5–15.5)
WBC: 3 10*3/uL — ABNORMAL LOW (ref 4.0–10.5)

## 2017-06-08 LAB — LIPID PANEL
CHOL/HDL RATIO: 5
CHOLESTEROL: 288 mg/dL — AB (ref 0–200)
HDL: 59.3 mg/dL (ref >39.00)
LDL CALC: 195 mg/dL — AB (ref 0–99)
NonHDL: 228.48
TRIGLYCERIDES: 169 mg/dL — AB (ref 0.0–149.0)
VLDL: 33.8 mg/dL (ref 0.0–40.0)

## 2017-06-08 LAB — TSH: TSH: 3.89 u[IU]/mL (ref 0.35–4.50)

## 2017-06-08 MED ORDER — PROPRANOLOL HCL 40 MG PO TABS: 40.0000 mg | ORAL_TABLET | Freq: Two times a day (BID) | ORAL | 3 refills | Status: DC

## 2017-06-08 MED ORDER — MELOXICAM 15 MG PO TABS: 15.0000 mg | ORAL_TABLET | Freq: Every day | ORAL | 3 refills | Status: DC

## 2017-06-08 MED ORDER — DEXLANSOPRAZOLE 30 MG PO CPDR: 30.0000 mg | DELAYED_RELEASE_CAPSULE | Freq: Every day | ORAL | 3 refills | Status: AC

## 2017-06-08 MED ORDER — LOSARTAN POTASSIUM 50 MG PO TABS: 50.0000 mg | ORAL_TABLET | Freq: Every day | ORAL | 3 refills | Status: DC

## 2017-06-08 MED ORDER — MONTELUKAST SODIUM 10 MG PO TABS
10.0000 mg | ORAL_TABLET | Freq: Every day | ORAL | 3 refills | Status: AC
Start: 2017-06-08 — End: ?

## 2017-06-08 NOTE — Progress Notes (Signed)
   Subjective:    Patient ID: Patricia Bullock, female    DOB: 1945/08/20, 71 y.o.   MRN: [id number]  HPI Here for a well exam. She feels well except for her arthritis pains. She cannot tolerate most NSAIDs due to GI effects, and Tylenol does not help. She also asks me to check her carotid arteries. She has noticed that her pulses are quite visible in the neck, although she denies any neurologic symptoms. Her BP is stable at home in the 110s or 120s over 70s.    Review of Systems  Constitutional: Negative.   HENT: Negative.   Eyes: Negative.   Respiratory: Negative.   Cardiovascular: Negative.   Gastrointestinal: Negative.   Genitourinary: Negative for decreased urine volume, difficulty urinating, dyspareunia, dysuria, enuresis, flank pain, frequency, hematuria, pelvic pain and urgency.  Musculoskeletal: Negative.   Skin: Negative.   Neurological: Negative.   Psychiatric/Behavioral: Negative.        Objective:   Physical Exam  Constitutional: She is oriented to person, place, and time. She appears well-developed and well-nourished. No distress.  HENT:  Head: Normocephalic and atraumatic.  Right Ear: External ear normal.  Left Ear: External ear normal.  Nose: Nose normal.  Mouth/Throat: Oropharynx is clear and moist. No oropharyngeal exudate.  Eyes: Conjunctivae and EOM are normal. Pupils are equal, round, and reactive to light. No scleral icterus.  Neck: Normal range of motion. Neck supple. No JVD present. No thyromegaly present.  Both carotids are very superficial and each pulsation is quite visible. No bruits.   Cardiovascular: Normal rate, regular rhythm, normal heart sounds and intact distal pulses. Exam reveals no gallop and no friction rub.  No murmur heard. Pulmonary/Chest: Effort normal and breath sounds normal. No respiratory distress. She has no wheezes. She has no rales. She exhibits no tenderness.  Abdominal: Soft. Bowel sounds are normal. She exhibits no distension and  no mass. There is no tenderness. There is no rebound and no guarding.  Musculoskeletal: Normal range of motion. She exhibits no edema or tenderness.  Lymphadenopathy:    She has no cervical adenopathy.  Neurological: She is alert and oriented to person, place, and time. She has normal reflexes. No cranial nerve deficit. She exhibits normal muscle tone. Coordination normal.  Skin: Skin is warm and dry. No rash noted. No erythema.  Psychiatric: She has a normal mood and affect. Her behavior is normal. Judgment and thought content normal.          Assessment & Plan:  Well exam. We discussed diet and exercise. Get fasting labs. She will try Meloxicam 15 mg daily for the arthritis pains. I think her carotids are fine and they are simply more visible than usual but we will arrange for her to have dopplers of these to evaluate further.  Alysia Penna, MD

## 2017-06-11 ENCOUNTER — Other Ambulatory Visit: Payer: Self-pay | Admitting: Family Medicine

## 2017-06-11 DIAGNOSIS — G451 Carotid artery syndrome (hemispheric): Secondary | ICD-10-CM

## 2017-06-11 DIAGNOSIS — R0989 Other specified symptoms and signs involving the circulatory and respiratory systems: Secondary | ICD-10-CM

## 2017-06-14 ENCOUNTER — Ambulatory Visit (INDEPENDENT_AMBULATORY_CARE_PROVIDER_SITE_OTHER): Payer: PPO

## 2017-06-14 DIAGNOSIS — G451 Carotid artery syndrome (hemispheric): Secondary | ICD-10-CM

## 2017-06-14 DIAGNOSIS — R0989 Other specified symptoms and signs involving the circulatory and respiratory systems: Secondary | ICD-10-CM

## 2017-06-17 ENCOUNTER — Ambulatory Visit
Admission: RE | Admit: 2017-06-17 | Discharge: 2017-06-17 | Disposition: A | Discharge status: Home / Self Care | Payer: PPO | Source: Ambulatory Visit | Attending: Obstetrics and Gynecology | Admitting: Obstetrics and Gynecology

## 2017-06-17 ENCOUNTER — Encounter: Payer: Self-pay | Admitting: Obstetrics and Gynecology

## 2017-06-17 DIAGNOSIS — Z1231 Encounter for screening mammogram for malignant neoplasm of breast: Secondary | ICD-10-CM | POA: Diagnosis not present

## 2017-08-25 DIAGNOSIS — I739 Peripheral vascular disease, unspecified: Secondary | ICD-10-CM | POA: Diagnosis not present

## 2017-08-25 DIAGNOSIS — E785 Hyperlipidemia, unspecified: Secondary | ICD-10-CM | POA: Diagnosis not present

## 2017-08-25 DIAGNOSIS — Z7689 Persons encountering health services in other specified circumstances: Secondary | ICD-10-CM | POA: Diagnosis not present

## 2017-08-25 DIAGNOSIS — K219 Gastro-esophageal reflux disease without esophagitis: Secondary | ICD-10-CM | POA: Diagnosis not present

## 2017-08-25 DIAGNOSIS — I10 Essential (primary) hypertension: Secondary | ICD-10-CM | POA: Diagnosis not present

## 2017-08-25 DIAGNOSIS — J329 Chronic sinusitis, unspecified: Secondary | ICD-10-CM | POA: Diagnosis not present

## 2017-08-25 DIAGNOSIS — F411 Generalized anxiety disorder: Secondary | ICD-10-CM | POA: Diagnosis not present

## 2017-08-25 DIAGNOSIS — I6521 Occlusion and stenosis of right carotid artery: Secondary | ICD-10-CM | POA: Diagnosis not present

## 2017-09-06 ENCOUNTER — Encounter: Payer: Self-pay | Admitting: Family Medicine

## 2017-09-08 ENCOUNTER — Telehealth: Payer: Self-pay | Admitting: Family Medicine

## 2017-09-08 NOTE — Telephone Encounter (Signed)
Copied from [place] [id number]. Topic: Quick Communication - Rx Refill/Question >> Sep 08, 2017  3:39 PM Neva Seat wrote: Diclofenac Gel for arthritic  Checking on status of new Rx.   [facility] - [place], [place] - [address] [place] [address] Phone: [phone] Fax: [phone]

## 2017-09-09 ENCOUNTER — Other Ambulatory Visit: Payer: Self-pay

## 2017-09-09 MED ORDER — DICLOFENAC SODIUM 1 % TD GEL: 2.0000 g | Freq: Four times a day (QID) | TRANSDERMAL | 5 refills | Status: DC

## 2017-09-09 NOTE — Telephone Encounter (Signed)
Sent to PCP for approval.  

## 2017-09-09 NOTE — Telephone Encounter (Signed)
Call in Diclofenac 1% TD gel to apply QID prn pain, 100 gm tube with 5 rf

## 2017-09-09 NOTE — Telephone Encounter (Signed)
Diclofenac - checking on status.   Don't see this on her medication list.  LOV 06/08/17 with Dr. Sarajane Jews  [facility] - [place], [address].

## 2017-09-14 ENCOUNTER — Other Ambulatory Visit: Payer: Self-pay | Admitting: Family Medicine

## 2017-09-14 NOTE — Telephone Encounter (Signed)
Last OV 06/08/2017   Sent to PCP for approval

## 2017-09-15 NOTE — Telephone Encounter (Signed)
Call in #90 with 5 rf 

## 2017-09-21 ENCOUNTER — Ambulatory Visit (INDEPENDENT_AMBULATORY_CARE_PROVIDER_SITE_OTHER): Payer: PPO | Admitting: Vascular Surgery

## 2017-09-21 ENCOUNTER — Encounter (INDEPENDENT_AMBULATORY_CARE_PROVIDER_SITE_OTHER): Payer: Self-pay | Admitting: Vascular Surgery

## 2017-09-21 VITALS — BP 193/97 | HR 78 | Resp 16 | Ht 65.0 in | Wt 156.0 lb

## 2017-09-21 DIAGNOSIS — I10 Essential (primary) hypertension: Secondary | ICD-10-CM

## 2017-09-21 DIAGNOSIS — I6529 Occlusion and stenosis of unspecified carotid artery: Secondary | ICD-10-CM | POA: Insufficient documentation

## 2017-09-21 DIAGNOSIS — E785 Hyperlipidemia, unspecified: Secondary | ICD-10-CM

## 2017-09-21 DIAGNOSIS — I6523 Occlusion and stenosis of bilateral carotid arteries: Secondary | ICD-10-CM | POA: Diagnosis not present

## 2017-09-21 NOTE — Assessment & Plan Note (Signed)
blood pressure control important in reducing the progression of atherosclerotic disease. On appropriate oral medications.  

## 2017-09-21 NOTE — Assessment & Plan Note (Signed)
lipid control important in reducing the progression of atherosclerotic disease.  We are going to try very low dose of Crestor at 5 mg today.

## 2017-09-21 NOTE — Progress Notes (Signed)
Patient ID: Patricia Bullock, female   DOB: 12-25-1945, 72 y.o.   MRN: [id number]  Chief Complaint  Patient presents with  . New Patient (Initial Visit)    ref [place] for Carotid stenosis    HPI Patricia Bullock is a 72 y.o. female.  I am asked to see the patient by Dr. Clayborn Bigness for evaluation of carotid stenosis.  The patient reports feeling a pulsatile fullness in her right neck.  She has a long history of significant hyperlipidemia with difficulty taking statins.  For this reason, she has been very aware of her atherosclerotic issues.  She exercises 90 minutes a day.  She tries to watch what she can eat.  She has not had a stroke or mini stroke, but has had the pulsatile fullness in her right neck.  She reports no fevers or chills.  An ultrasound performed several months ago demonstrated 60-79% right ICA stenosis in 1-39% left ICA stenosis. she is referred for further evaluation and management.   Past Medical History:  Diagnosis Date  . Allergic rhinitis   . GERD (gastroesophageal reflux disease)   . Headache(784.0)   . Hyperlipidemia   . Hypertension     Past Surgical History:  Procedure Laterality Date  . benign skin lesions removed    . BREAST CYST ASPIRATION Right    NEG  . COLONOSCOPY  05-23-13   per Dr. Sharlett Iles, adenomatous polyps, repeat in 5 yrs   . DILATION AND CURETTAGE OF UTERUS  1992    Family History  Problem Relation Age of Onset  . Kidney cancer Mother   . Diabetes Unknown   . Hyperlipidemia Unknown   . Hypertension Unknown   . Cancer Unknown   . Dementia Father   . Colon cancer Maternal Aunt   . Breast cancer Maternal Aunt   . Esophageal cancer Neg Hx   . Rectal cancer Neg Hx   . Stomach cancer Neg Hx      Social History Social History   Tobacco Use  . Smoking status: Never Smoker  . Smokeless tobacco: Never Used  Substance Use Topics  . Alcohol use: No    Alcohol/week: 0.0 oz  . Drug use: No     Allergies  Allergen Reactions  . Ace  Inhibitors     REACTION: cough  . Amlodipine Besylate     REACTION: swelling  . Citalopram Hydrobromide Itching  . Clonidine Hydrochloride     REACTION: sleepiness  . Ezetimibe     REACTION: muscle aches  . Penicillins   . Statins     REACTION: muscle aches    Current Outpatient Medications  Medication Sig Dispense Refill  . aspirin 81 MG tablet Take 81 mg by mouth daily.      Marland Kitchen azelastine (ASTELIN) 0.1 % nasal spray Place into both nostrils 2 (two) times daily. Use in each nostril as directed    . clonazePAM (KLONOPIN) 0.5 MG tablet TAKE 1 TABLET BY MOUTH 3 TIMES DAILY AS NEEDED 90 tablet 5  . Dexlansoprazole (DEXILANT) 30 MG capsule Take 1 capsule (30 mg total) by mouth daily. 90 capsule 3  . diclofenac sodium (VOLTAREN) 1 % GEL Apply 2 g topically 4 (four) times daily. 100 g 5  . IPRATROPIUM BROMIDE NA Place into the nose daily.    Marland Kitchen levocetirizine (XYZAL) 5 MG tablet Take 5 mg by mouth every evening.    Marland Kitchen losartan (COZAAR) 50 MG tablet Take 1 tablet (50 mg total) by mouth  daily. 90 tablet 3  . meloxicam (MOBIC) 15 MG tablet Take 1 tablet (15 mg total) by mouth daily. 90 tablet 3  . Misc Natural Products (OSTEO BI-FLEX JOINT SHIELD PO) Take by mouth.      . montelukast (SINGULAIR) 10 MG tablet Take 1 tablet (10 mg total) by mouth at bedtime. 90 tablet 3  . Probiotic Product (PROBIOTIC DAILY PO) Take by mouth daily.    . propranolol (INDERAL) 40 MG tablet Take 1 tablet (40 mg total) by mouth 2 (two) times daily. 180 tablet 3   No current facility-administered medications for this visit.       REVIEW OF SYSTEMS (Negative unless checked)  Constitutional: [] Weight loss  [] Fever  [] Chills Cardiac: [] Chest pain   [] Chest pressure   [] Palpitations   [] Shortness of breath when laying flat   [] Shortness of breath at rest   [] Shortness of breath with exertion. Vascular:  [] Pain in legs with walking   [] Pain in legs at rest   [] Pain in legs when laying flat   [] Claudication   [] Pain in  feet when walking  [] Pain in feet at rest  [] Pain in feet when laying flat   [] History of DVT   [] Phlebitis   [] Swelling in legs   [] Varicose veins   [] Non-healing ulcers Pulmonary:   [] Uses home oxygen   [] Productive cough   [] Hemoptysis   [] Wheeze  [] COPD   [] Asthma Neurologic:  [] Dizziness  [] Blackouts   [] Seizures   [] History of stroke   [] History of TIA  [] Aphasia   [] Temporary blindness   [] Dysphagia   [] Weakness or numbness in arms   [] Weakness or numbness in legs Musculoskeletal:  [] Arthritis   [] Joint swelling   [] Joint pain   [] Low back pain Hematologic:  [] Easy bruising  [] Easy bleeding   [] Hypercoagulable state   [] Anemic  [] Hepatitis Gastrointestinal:  [] Blood in stool   [] Vomiting blood  [x] Gastroesophageal reflux/heartburn   [] Abdominal pain Genitourinary:  [] Chronic kidney disease   [] Difficult urination  [] Frequent urination  [] Burning with urination   [] Hematuria Skin:  [] Rashes   [] Ulcers   [] Wounds Psychological:  [] History of anxiety   []  History of major depression.    Physical Exam BP (!) 193/97 (BP Location: Right Arm)   Pulse 78   Resp 16   Ht 5\' 5"  (1.651 m)   Wt 70.8 kg (156 lb)   BMI 25.96 kg/m  Gen:  WD/WN, NAD.  Appears younger than stated age Head: [place]/AT, No temporalis wasting Ear/Nose/Throat: Hearing grossly intact, nares w/o erythema or drainage, oropharynx w/o Erythema/Exudate Eyes: Conjunctiva clear, sclera non-icteric  Neck: trachea midline.  No JVD.  Right carotid bruit present Pulmonary:  Good air movement, clear to auscultation bilaterally.  Cardiac: RRR, normal S1, S2 Vascular:  Vessel Right Left  Radial Palpable Palpable                                   Gastrointestinal: soft, non-tender/non-distended.  Musculoskeletal: M/S 5/5 throughout.  Extremities without ischemic changes.  No deformity or atrophy.  No edema. Neurologic: Sensation grossly intact in extremities.  Symmetrical.  Speech is fluent. Motor exam as listed  above. Psychiatric: Judgment intact, Mood & affect appropriate for pt's clinical situation. Dermatologic: No rashes or ulcers noted.  No cellulitis or open wounds.  Radiology No results found.  Labs No results found for this or any previous visit (from the past 2160 hour(s)).  Assessment/Plan:  Essential hypertension blood pressure control important in reducing the progression of atherosclerotic disease. On appropriate oral medications.   Hyperlipidemia lipid control important in reducing the progression of atherosclerotic disease.  We are going to try very low dose of Crestor at 5 mg today.   Carotid stenosis An ultrasound performed several months ago demonstrated 60-79% right ICA stenosis in 1-39% left ICA stenosis  Although this is below the threshold for prophylactic repair, it is a significant lesion on the right and will need to be monitored closely.  Her last ultrasound was over 3 months ago.  I would plan to get another ultrasound in 2-3 months for follow-up.  We are adding a statin agent.  She will continue her aspirin and her good lifestyle habits.  Discussed the risks including the small risk of stroke or TIA.  Return with her ultrasound in 2-3 months.      Leotis Pain 09/21/2017, 9:48 AM   This note was created with Dragon medical transcription system.  Any errors from dictation are unintentional.

## 2017-09-21 NOTE — Assessment & Plan Note (Signed)
An ultrasound performed several months ago demonstrated 60-79% right ICA stenosis in 1-39% left ICA stenosis  Although this is below the threshold for prophylactic repair, it is a significant lesion on the right and will need to be monitored closely.  Her last ultrasound was over 3 months ago.  I would plan to get another ultrasound in 2-3 months for follow-up.  We are adding a statin agent.  She will continue her aspirin and her good lifestyle habits.  Discussed the risks including the small risk of stroke or TIA.  Return with her ultrasound in 2-3 months.

## 2017-09-21 NOTE — Patient Instructions (Signed)
Carotid Artery Disease The carotid arteries are arteries on both sides of the neck. They carry blood to the brain. Carotid artery disease is when the arteries get smaller (narrow) or get blocked. If these arteries get smaller or get blocked, you are more likely to have a stroke or warning stroke (transient ischemic attack). Follow these instructions at home:  Take medicines as told by your doctor. Make sure you understand all your medicine instructions. Do not stop your medicines without talking to your doctor first.  Follow your doctor's diet instructions. It is important to eat a healthy diet that includes plenty of: ? Fresh fruits. ? Vegetables. ? Lean meats.  Avoid: ? High-fat foods. ? High-sodium foods. ? Foods that are fried, overly processed, or have poor nutritional value.  Stay a healthy weight.  Stay active. Get at least 30 minutes of activity every day.  Do not smoke.  Limit alcohol use to: ? No more than 2 drinks a day for men. ? No more than 1 drink a day for women who are not pregnant.  Do not use illegal drugs.  Keep all doctor visits as told. Get help right away if:  You have sudden weakness or loss of feeling (numbness) on one side of the body, such as the face, arm, or leg.  You have sudden confusion.  You have trouble speaking (aphasia) or understanding.  You have sudden trouble seeing out of one or both eyes.  You have sudden trouble walking.  You have dizziness or feel like you might pass out (faint).  You have a loss of balance or your movements are not steady (uncoordinated).  You have a sudden, severe headache with no known cause.  You have trouble swallowing (dysphagia). Call your local emergency services (911 in [place] Do notdrive yourself to the clinic or hospital. This information is not intended to replace advice given to you by your health care provider. Make sure you discuss any questions you have with your health care  provider. Document Released: 05/25/2012 Document Revised: 11/14/2015 Document Reviewed: 12/07/2012 Elsevier Interactive Patient Education  2018 [employer]  

## 2017-10-01 DIAGNOSIS — L821 Other seborrheic keratosis: Secondary | ICD-10-CM | POA: Diagnosis not present

## 2017-10-01 DIAGNOSIS — D2339 Other benign neoplasm of skin of other parts of face: Secondary | ICD-10-CM | POA: Diagnosis not present

## 2017-10-01 DIAGNOSIS — L728 Other follicular cysts of the skin and subcutaneous tissue: Secondary | ICD-10-CM | POA: Diagnosis not present

## 2017-10-28 DIAGNOSIS — R0989 Other specified symptoms and signs involving the circulatory and respiratory systems: Secondary | ICD-10-CM | POA: Diagnosis not present

## 2017-10-28 DIAGNOSIS — E782 Mixed hyperlipidemia: Secondary | ICD-10-CM | POA: Diagnosis not present

## 2017-10-28 DIAGNOSIS — R0609 Other forms of dyspnea: Secondary | ICD-10-CM | POA: Diagnosis not present

## 2017-10-28 DIAGNOSIS — R030 Elevated blood-pressure reading, without diagnosis of hypertension: Secondary | ICD-10-CM | POA: Diagnosis not present

## 2017-11-08 DIAGNOSIS — R030 Elevated blood-pressure reading, without diagnosis of hypertension: Secondary | ICD-10-CM | POA: Diagnosis not present

## 2017-11-08 DIAGNOSIS — R0989 Other specified symptoms and signs involving the circulatory and respiratory systems: Secondary | ICD-10-CM | POA: Diagnosis not present

## 2017-11-23 DIAGNOSIS — H6123 Impacted cerumen, bilateral: Secondary | ICD-10-CM | POA: Diagnosis not present

## 2017-11-23 DIAGNOSIS — H6063 Unspecified chronic otitis externa, bilateral: Secondary | ICD-10-CM | POA: Diagnosis not present

## 2017-11-24 DIAGNOSIS — Z79899 Other long term (current) drug therapy: Secondary | ICD-10-CM | POA: Diagnosis not present

## 2017-12-15 ENCOUNTER — Other Ambulatory Visit (INDEPENDENT_AMBULATORY_CARE_PROVIDER_SITE_OTHER): Payer: Self-pay | Admitting: Vascular Surgery

## 2017-12-16 DIAGNOSIS — I10 Essential (primary) hypertension: Secondary | ICD-10-CM | POA: Diagnosis not present

## 2017-12-16 DIAGNOSIS — R0989 Other specified symptoms and signs involving the circulatory and respiratory systems: Secondary | ICD-10-CM | POA: Diagnosis not present

## 2017-12-16 DIAGNOSIS — E782 Mixed hyperlipidemia: Secondary | ICD-10-CM | POA: Diagnosis not present

## 2017-12-17 ENCOUNTER — Ambulatory Visit (INDEPENDENT_AMBULATORY_CARE_PROVIDER_SITE_OTHER): Payer: PPO | Admitting: Vascular Surgery

## 2017-12-17 ENCOUNTER — Encounter (INDEPENDENT_AMBULATORY_CARE_PROVIDER_SITE_OTHER): Payer: PPO

## 2017-12-30 DIAGNOSIS — H04123 Dry eye syndrome of bilateral lacrimal glands: Secondary | ICD-10-CM | POA: Diagnosis not present

## 2018-01-17 ENCOUNTER — Other Ambulatory Visit (INDEPENDENT_AMBULATORY_CARE_PROVIDER_SITE_OTHER): Payer: Self-pay | Admitting: Vascular Surgery

## 2018-01-25 DIAGNOSIS — R05 Cough: Secondary | ICD-10-CM | POA: Diagnosis not present

## 2018-01-25 DIAGNOSIS — J300 Vasomotor rhinitis: Secondary | ICD-10-CM | POA: Diagnosis not present

## 2018-02-22 ENCOUNTER — Ambulatory Visit (INDEPENDENT_AMBULATORY_CARE_PROVIDER_SITE_OTHER): Payer: PPO | Admitting: Vascular Surgery

## 2018-02-22 ENCOUNTER — Encounter (INDEPENDENT_AMBULATORY_CARE_PROVIDER_SITE_OTHER): Payer: Self-pay | Admitting: Vascular Surgery

## 2018-02-22 ENCOUNTER — Ambulatory Visit (INDEPENDENT_AMBULATORY_CARE_PROVIDER_SITE_OTHER): Payer: PPO

## 2018-02-22 VITALS — BP 198/111 | HR 88 | Resp 13 | Ht 65.0 in | Wt 160.0 lb

## 2018-02-22 DIAGNOSIS — I6523 Occlusion and stenosis of bilateral carotid arteries: Secondary | ICD-10-CM

## 2018-02-22 DIAGNOSIS — E785 Hyperlipidemia, unspecified: Secondary | ICD-10-CM

## 2018-02-22 DIAGNOSIS — I10 Essential (primary) hypertension: Secondary | ICD-10-CM

## 2018-02-22 NOTE — Progress Notes (Signed)
MRN : [id number]  Patricia Bullock is a 72 y.o. ([birth year]) female who presents with chief complaint of  Chief Complaint  Patient presents with  . Carotid    2-3 month u/s follow up  .  History of Present Illness: patient returns in follow up of carotid disease. No new symptoms.  No focal neurologic symptoms. Specifically, the patient denies amaurosis fugax, speech or swallowing difficulties, or arm or leg weakness or numbness.  Carotid duplex today shows 40 to 59% right ICA stenosis in 1 to 39% left ICA stenosis without significant progression and actually slightly better velocities than her previous study.  Current Outpatient Medications  Medication Sig Dispense Refill  . aspirin 81 MG tablet Take 81 mg by mouth daily.      Marland Kitchen azelastine (ASTELIN) 0.1 % nasal spray Place into both nostrils 2 (two) times daily. Use in each nostril as directed    . clonazePAM (KLONOPIN) 0.5 MG tablet TAKE 1 TABLET BY MOUTH 3 TIMES DAILY AS NEEDED 90 tablet 5  . Coenzyme Q10 100 MG capsule Take by mouth.    . Dexlansoprazole (DEXILANT) 30 MG capsule Take 1 capsule (30 mg total) by mouth daily. 90 capsule 3  . diclofenac sodium (VOLTAREN) 1 % GEL Apply 2 g topically 4 (four) times daily. 100 g 5  . ezetimibe (ZETIA) 10 MG tablet Take by mouth.    . IPRATROPIUM BROMIDE NA Place into the nose daily.    Marland Kitchen levocetirizine (XYZAL) 5 MG tablet Take 5 mg by mouth every evening.    Marland Kitchen losartan (COZAAR) 50 MG tablet Take 1 tablet (50 mg total) by mouth daily. 90 tablet 3  . meloxicam (MOBIC) 15 MG tablet Take 1 tablet (15 mg total) by mouth daily. 90 tablet 3  . Misc Natural Products (OSTEO BI-FLEX JOINT SHIELD PO) Take by mouth.      . montelukast (SINGULAIR) 10 MG tablet Take 1 tablet (10 mg total) by mouth at bedtime. 90 tablet 3  . Probiotic Product (PROBIOTIC DAILY PO) Take by mouth daily.    . propranolol (INDERAL) 40 MG tablet Take 1 tablet (40 mg total) by mouth 2 (two) times daily. 180 tablet 3  .  rosuvastatin (CRESTOR) 5 MG tablet TAKE 1 TABLET DAILY 30 tablet 1  . spironolactone (ALDACTONE) 25 MG tablet Take by mouth.    . tobramycin (TOBREX) 0.3 % ophthalmic solution      No current facility-administered medications for this visit.     Past Medical History:  Diagnosis Date  . Allergic rhinitis   . GERD (gastroesophageal reflux disease)   . Headache(784.0)   . Hyperlipidemia   . Hypertension     Past Surgical History:  Procedure Laterality Date  . benign skin lesions removed    . BREAST CYST ASPIRATION Right    NEG  . COLONOSCOPY  05-23-13   per Dr. Sharlett Iles, adenomatous polyps, repeat in 5 yrs   . DILATION AND CURETTAGE OF UTERUS  1992   Family History  Problem Relation Age of Onset  . Kidney cancer Mother   . Diabetes Unknown   . Hyperlipidemia Unknown   . Hypertension Unknown   . Cancer Unknown   . Dementia Father   . Colon cancer Maternal Aunt   . Breast cancer Maternal Aunt   . Esophageal cancer Neg Hx   . Rectal cancer Neg Hx   . Stomach cancer Neg Hx      Social History Social History  Tobacco Use  . Smoking status: Never Smoker  . Smokeless tobacco: Never Used  Substance Use Topics  . Alcohol use: No    Alcohol/week: 0.0 oz  . Drug use: No          Allergies  Allergen Reactions  . Ace Inhibitors     REACTION: cough  . Amlodipine Besylate     REACTION: swelling  . Citalopram Hydrobromide Itching  . Clonidine Hydrochloride     REACTION: sleepiness  . Ezetimibe     REACTION: muscle aches  . Penicillins   . Statins     REACTION: muscle aches      REVIEW OF SYSTEMS (Negative unless checked)  Constitutional: [] Weight loss  [] Fever  [] Chills Cardiac: [] Chest pain   [] Chest pressure   [] Palpitations   [] Shortness of breath when laying flat   [] Shortness of breath at rest   [] Shortness of breath with exertion. Vascular:  [] Pain in legs with walking   [] Pain in legs at rest   [] Pain in  legs when laying flat   [] Claudication   [] Pain in feet when walking  [] Pain in feet at rest  [] Pain in feet when laying flat   [] History of DVT   [] Phlebitis   [] Swelling in legs   [] Varicose veins   [] Non-healing ulcers Pulmonary:   [] Uses home oxygen   [] Productive cough   [] Hemoptysis   [] Wheeze  [] COPD   [] Asthma Neurologic:  [] Dizziness  [] Blackouts   [] Seizures   [] History of stroke   [] History of TIA  [] Aphasia   [] Temporary blindness   [] Dysphagia   [] Weakness or numbness in arms   [] Weakness or numbness in legs Musculoskeletal:  [] Arthritis   [] Joint swelling   [] Joint pain   [] Low back pain Hematologic:  [] Easy bruising  [] Easy bleeding   [] Hypercoagulable state   [] Anemic  [] Hepatitis Gastrointestinal:  [] Blood in stool   [] Vomiting blood  [x] Gastroesophageal reflux/heartburn   [] Abdominal pain Genitourinary:  [] Chronic kidney disease   [] Difficult urination  [] Frequent urination  [] Burning with urination   [] Hematuria Skin:  [] Rashes   [] Ulcers   [] Wounds Psychological:  [] History of anxiety   []  History of major depression.    Physical Examination  Vitals:   02/22/18 1543 02/22/18 1544  BP: (!) 207/104 (!) 198/111  Pulse: 89 88  Resp: 13   Weight: 160 lb (72.6 kg)   Height: 5\' 5"  (1.651 m)    Body mass index is 26.63 kg/m. Gen:  WD/WN, NAD. Appears younger than stated age Head: /AT, No temporalis wasting. Ear/Nose/Throat: Hearing grossly intact, nares w/o erythema or drainage, trachea midline Eyes: Conjunctiva clear. Sclera non-icteric Neck: Supple.  Right carotid bruit  Pulmonary:  Good air movement, equal and clear to auscultation bilaterally.  Cardiac: RRR, No JVD Vascular:  Vessel Right Left  Radial Palpable Palpable                                    Musculoskeletal: M/S 5/5 throughout.  No deformity or atrophy. No edema. Neurologic: CN 2-12 intact. Sensation grossly intact in extremities.  Symmetrical.  Speech is fluent. Motor exam as listed  above. Psychiatric: Judgment intact, Mood & affect appropriate for pt's clinical situation. Dermatologic: No rashes or ulcers noted.  No cellulitis or open wounds. Lymph : No Cervical, Axillary, or Inguinal lymphadenopathy.     CBC Lab Results  Component Value Date   WBC 3.0 (L) 06/08/2017  HGB 13.2 06/08/2017   HCT 40.6 06/08/2017   MCV 90.2 06/08/2017   PLT 210.0 06/08/2017    BMET    Component Value Date/Time   NA 143 06/08/2017 0856   NA 144 10/09/2011 0015   K 4.1 06/08/2017 0856   K 3.7 10/09/2011 0015   CL 105 06/08/2017 0856   CL 108 (H) 10/09/2011 0015   CO2 31 06/08/2017 0856   CO2 26 10/09/2011 0015   GLUCOSE 100 (H) 06/08/2017 0856   GLUCOSE 123 (H) 10/09/2011 0015   BUN 14 06/08/2017 0856   BUN 13 10/09/2011 0015   CREATININE 0.74 06/08/2017 0856   CREATININE 0.67 10/09/2011 0015   CALCIUM 9.2 06/08/2017 0856   CALCIUM 9.4 10/09/2011 0015   GFRNONAA >60 10/09/2011 0015   GFRAA >60 10/09/2011 0015   CrCl cannot be calculated (Patient's most recent lab result is older than the maximum 21 days allowed.).  COAG No results found for: INR, PROTIME  Radiology No results found.    Assessment/Plan Essential hypertension blood pressure control important in reducing the progression of atherosclerotic disease. On appropriate oral medications.   Hyperlipidemia lipid control important in reducing the progression of atherosclerotic disease.  We are going to try very low dose of Crestor at 5 mg today.  Carotid stenosis Carotid duplex today shows 40 to 59% right ICA stenosis in 1 to 39% left ICA stenosis without significant progression and actually slightly better velocities than her previous study. Continue current medical regimen.  Recheck in 6 months.  No role for intervention at this time.    Leotis Pain, MD  02/22/2018 4:25 PM    This note was created with Dragon medical transcription system.  Any errors from dictation are purely unintentional

## 2018-02-22 NOTE — Assessment & Plan Note (Signed)
Carotid duplex today shows 40 to 59% right ICA stenosis in 1 to 39% left ICA stenosis without significant progression and actually slightly better velocities than her previous study. Continue current medical regimen.  Recheck in 6 months.  No role for intervention at this time.

## 2018-03-22 ENCOUNTER — Other Ambulatory Visit: Payer: Self-pay | Admitting: Family Medicine

## 2018-05-04 ENCOUNTER — Other Ambulatory Visit: Payer: Self-pay | Admitting: Obstetrics and Gynecology

## 2018-05-04 DIAGNOSIS — Z1231 Encounter for screening mammogram for malignant neoplasm of breast: Secondary | ICD-10-CM

## 2018-05-16 ENCOUNTER — Telehealth: Payer: Self-pay | Admitting: Gastroenterology

## 2018-05-16 NOTE — Telephone Encounter (Signed)
Patient calling to schedule recall colon. Former Dr.Patterson pt. Recall in system with Dr.Danis but pt requesting to see Dr.Pyrtle. Please advise on scheduling.

## 2018-05-16 NOTE — Telephone Encounter (Signed)
See note below regarding colon

## 2018-05-16 NOTE — Telephone Encounter (Signed)
Left voicemail on mobile number for patient to call back and schedule recall with Dr.Pyrtle. Recall updated in system.

## 2018-05-16 NOTE — Telephone Encounter (Signed)
Ok with me 

## 2018-05-16 NOTE — Telephone Encounter (Signed)
Previous patterson pt due for colon. Recall states Dr. Loletha Carrow. Pt is requesting to have colon with Dr. Hilarie Fredrickson. Does not look like pt ever saw Dr. Loletha Carrow. Please advise if ok to schedule colon with Dr. Hilarie Fredrickson.

## 2018-05-23 ENCOUNTER — Encounter: Payer: Self-pay | Admitting: Internal Medicine

## 2018-05-27 DIAGNOSIS — J300 Vasomotor rhinitis: Secondary | ICD-10-CM | POA: Diagnosis not present

## 2018-05-27 DIAGNOSIS — H6123 Impacted cerumen, bilateral: Secondary | ICD-10-CM | POA: Diagnosis not present

## 2018-06-13 ENCOUNTER — Other Ambulatory Visit: Payer: Self-pay

## 2018-06-13 ENCOUNTER — Encounter: Payer: Self-pay | Admitting: Internal Medicine

## 2018-06-13 ENCOUNTER — Ambulatory Visit (AMBULATORY_SURGERY_CENTER): Payer: Self-pay

## 2018-06-13 VITALS — Ht 65.0 in | Wt 163.2 lb

## 2018-06-13 DIAGNOSIS — Z86010 Personal history of colonic polyps: Secondary | ICD-10-CM

## 2018-06-13 MED ORDER — NA SULFATE-K SULFATE-MG SULF 17.5-3.13-1.6 GM/177ML PO SOLN: 1.0000 | Freq: Once | ORAL | 0 refills | Status: AC

## 2018-06-13 NOTE — Progress Notes (Signed)
No egg or soy allergy known to patient  No issues with past sedation with any surgeries  or procedures, no intubation problems  No diet pills per patient No home 02 use per patient  No blood thinners per patient  Pt denies issues with constipation  No A fib or A flutter  EMMI video sent to pt's e mail , pt declined    

## 2018-06-21 ENCOUNTER — Ambulatory Visit
Admission: RE | Admit: 2018-06-21 | Discharge: 2018-06-21 | Disposition: A | Discharge status: Home / Self Care | Payer: PPO | Source: Ambulatory Visit | Attending: Obstetrics and Gynecology | Admitting: Obstetrics and Gynecology

## 2018-06-21 DIAGNOSIS — Z1231 Encounter for screening mammogram for malignant neoplasm of breast: Secondary | ICD-10-CM | POA: Diagnosis not present

## 2018-06-22 ENCOUNTER — Encounter: Payer: Self-pay | Admitting: Obstetrics and Gynecology

## 2018-06-24 ENCOUNTER — Telehealth: Payer: Self-pay | Admitting: Internal Medicine

## 2018-06-24 NOTE — Telephone Encounter (Signed)
Left voicemail for patient that we will have suprep sample at front desk for her to pick up since pharmacy does not have any available.

## 2018-06-24 NOTE — Telephone Encounter (Signed)
Pt is sched for upcoming colon 06/28/18.  Pt needs an alt to Suprep sent to [facility].  Pharmacy is OOS on Suprep and unable to order.

## 2018-06-24 NOTE — Telephone Encounter (Signed)
Pt called back in and advised that the pharm has found the prep no call is needed back from the nurse.

## 2018-06-28 ENCOUNTER — Ambulatory Visit (AMBULATORY_SURGERY_CENTER): Payer: PPO | Admitting: Internal Medicine

## 2018-06-28 ENCOUNTER — Encounter: Payer: Self-pay | Admitting: Internal Medicine

## 2018-06-28 VITALS — BP 141/54 | HR 63 | Temp 97.8°F | Resp 9 | Ht 65.0 in | Wt 160.0 lb

## 2018-06-28 DIAGNOSIS — Z86010 Personal history of colonic polyps: Secondary | ICD-10-CM | POA: Diagnosis not present

## 2018-06-28 DIAGNOSIS — K635 Polyp of colon: Secondary | ICD-10-CM

## 2018-06-28 DIAGNOSIS — D122 Benign neoplasm of ascending colon: Secondary | ICD-10-CM

## 2018-06-28 MED ORDER — SODIUM CHLORIDE 0.9 % IV SOLN: 500.0000 mL | Freq: Once | INTRAVENOUS | Status: DC

## 2018-06-28 NOTE — Op Note (Signed)
[facility] Patient Name: Patricia Bullock Procedure Date: 06/28/2018 8:11 AM MRN: [id number] Endoscopist: Jerene Bears , MD Age: 73 Referring MD:  Date of Birth: [birth year] Gender: Female Account #: [id number] Procedure:                Colonoscopy Indications:              Surveillance: Personal history of adenomatous                            polyps on last colonoscopy 5 years ago Medicines:                Monitored Anesthesia Care Procedure:                Pre-Anesthesia Assessment:                           - Prior to the procedure, a History and Physical                            was performed, and patient medications and                            allergies were reviewed. The patient's tolerance of                            previous anesthesia was also reviewed. The risks                            and benefits of the procedure and the sedation                            options and risks were discussed with the patient.                            All questions were answered, and informed consent                            was obtained. Prior Anticoagulants: The patient has                            taken no previous anticoagulant or antiplatelet                            agents. ASA Grade Assessment: II - A patient with                            mild systemic disease. After reviewing the risks                            and benefits, the patient was deemed in                            satisfactory condition to undergo the procedure.  After obtaining informed consent, the colonoscope                            was passed under direct vision. Throughout the                            procedure, the patient's blood pressure, pulse, and                            oxygen saturations were monitored continuously. The                            Colonoscope was introduced through the anus and                            advanced to the the cecum,  identified by                            transillumination. The quality of the bowel                            preparation was good. The ileocecal valve,                            appendiceal orifice, and rectum were photographed. Scope In: 8:22:25 AM Scope Out: 8:45:26 AM Scope Withdrawal Time: 0 hours 9 minutes 53 seconds  Total Procedure Duration: 0 hours 23 minutes 1 second  Findings:                 Hemorrhoids were found on perianal exam.                           A 3 mm polyp was found in the ascending colon. The                            polyp was sessile. The polyp was removed with a                            cold snare. Resection and retrieval were complete.                           A few small-mouthed diverticula were found in the                            sigmoid colon.                           External and internal hemorrhoids were found during                            retroflexion and during digital exam. The                            hemorrhoids were small. Complications:  No immediate complications. Estimated Blood Loss:     Estimated blood loss was minimal. Impression:               - One 3 mm polyp in the ascending colon, removed                            with a cold snare. Resected and retrieved.                           - Diverticulosis in the sigmoid colon.                           - Small external and internal hemorrhoids. Recommendation:           - Patient has a contact number available for                            emergencies. The signs and symptoms of potential                            delayed complications were discussed with the                            patient. Return to normal activities tomorrow.                            Written discharge instructions were provided to the                            patient.                           - Resume previous diet.                           - Continue present medications.                            - Await pathology results.                           - Repeat colonoscopy is recommended for                            surveillance. The colonoscopy date will be                            determined after pathology results from today's                            exam become available for review. Jerene Bears, MD 06/28/2018 9:22:01 AM This report has been signed electronically.

## 2018-06-28 NOTE — Progress Notes (Signed)
Spontaneous respirations throughout. VSS. Resting comfortably. To PACU on room air. Report to  RN. 

## 2018-06-28 NOTE — Progress Notes (Signed)
Pt's states no medical or surgical changes since previsit or office visit. 

## 2018-06-28 NOTE — Progress Notes (Signed)
Called to room to assist during endoscopic procedure.  Patient ID and intended procedure confirmed with present staff. Received instructions for my participation in the procedure from the performing physician.  

## 2018-06-29 ENCOUNTER — Telehealth: Payer: Self-pay

## 2018-06-29 ENCOUNTER — Other Ambulatory Visit: Payer: Self-pay | Admitting: Family Medicine

## 2018-06-29 ENCOUNTER — Telehealth: Payer: Self-pay | Admitting: Internal Medicine

## 2018-06-29 NOTE — Telephone Encounter (Signed)
Pt returned calls stating that she is feeling fine.

## 2018-06-29 NOTE — Telephone Encounter (Signed)
First post procedure call, no answer. 

## 2018-06-29 NOTE — Telephone Encounter (Signed)
Second post procedure follow up call, no answer 

## 2018-07-04 ENCOUNTER — Encounter: Payer: Self-pay | Admitting: Internal Medicine

## 2018-07-08 ENCOUNTER — Other Ambulatory Visit: Payer: Self-pay | Admitting: Family Medicine

## 2018-07-15 DIAGNOSIS — J300 Vasomotor rhinitis: Secondary | ICD-10-CM | POA: Diagnosis not present

## 2018-07-15 DIAGNOSIS — J0100 Acute maxillary sinusitis, unspecified: Secondary | ICD-10-CM | POA: Diagnosis not present

## 2018-07-15 DIAGNOSIS — R05 Cough: Secondary | ICD-10-CM | POA: Diagnosis not present

## 2018-07-18 ENCOUNTER — Other Ambulatory Visit (HOSPITAL_COMMUNITY)
Admission: RE | Admit: 2018-07-18 | Discharge: 2018-07-18 | Disposition: A | Discharge status: Home / Self Care | Payer: PPO | Source: Ambulatory Visit | Attending: Obstetrics and Gynecology | Admitting: Obstetrics and Gynecology

## 2018-07-18 ENCOUNTER — Encounter: Payer: Self-pay | Admitting: Obstetrics and Gynecology

## 2018-07-18 ENCOUNTER — Ambulatory Visit (INDEPENDENT_AMBULATORY_CARE_PROVIDER_SITE_OTHER): Payer: PPO | Admitting: Obstetrics and Gynecology

## 2018-07-18 VITALS — BP 170/90 | HR 74 | Ht 65.0 in | Wt 163.0 lb

## 2018-07-18 DIAGNOSIS — Z01419 Encounter for gynecological examination (general) (routine) without abnormal findings: Secondary | ICD-10-CM | POA: Diagnosis not present

## 2018-07-18 DIAGNOSIS — Z124 Encounter for screening for malignant neoplasm of cervix: Secondary | ICD-10-CM

## 2018-07-18 DIAGNOSIS — Z1239 Encounter for other screening for malignant neoplasm of breast: Secondary | ICD-10-CM

## 2018-07-18 NOTE — Patient Instructions (Signed)
I value your feedback and entrusting us with your care. If you get a [facility] patient survey, I would appreciate you taking the time to let us know about your experience today. Thank you! 

## 2018-07-18 NOTE — Progress Notes (Signed)
PCP: Laurey Morale, MD   Chief Complaint  Patient presents with  . Gynecologic Exam    Medicare breast/pelvic    HPI:      Ms. Patricia Bullock is a 73 y.o. No obstetric history on file. who LMP was No LMP recorded. Patient is postmenopausal., presents today for her annual examination.  Her menses are absent due to menopause. She does not have intermenstrual bleeding.  She does not have vasomotor sx.   Sex activity: not sexually active. She does not have vaginal dryness.  Last Pap: May 10, 2015  Results were: no abnormalities /neg HPV DNA.  Hx of STDs: none  Last mammogram: June 21, 2018  Results were: normal--routine follow-up in 12 months There is a FH of breast cancer in her mat aunt, genetic testing not indicated. There is no FH of ovarian cancer. The patient does do self-breast exams.  Colonoscopy: 06/2018 Repeat due: never   Tobacco use: The patient denies current or previous tobacco use. Alcohol use: none Exercise: very active  She does get adequate calcium and Vitamin D in her diet.  Labs, DEXA with PCP.   Past Medical History:  Diagnosis Date  . Allergic rhinitis   . Anxiety   . Arthritis   . Fibroids 05/13/1990   Leiomyoma of Uterus  . GERD (gastroesophageal reflux disease)   . Headache(784.0)   . Hyperlipidemia   . Hypertension   . Panic disorder     Past Surgical History:  Procedure Laterality Date  . benign skin lesions removed    . BREAST CYST ASPIRATION Right    NEG  . COLONOSCOPY  05-23-13   per Dr. Sharlett Iles, adenomatous polyps, repeat in 5 yrs   . DILATION AND CURETTAGE OF UTERUS  1992  . POLYPECTOMY      Family History  Problem Relation Age of Onset  . Kidney cancer Mother 21       family history also  . Diabetes Mother        Type 2  . Hypertension Mother   . Diabetes Other   . Hyperlipidemia Other   . Hypertension Other   . Cancer Other   . Dementia Father   . Colon cancer Maternal Aunt   . Breast cancer Maternal Aunt     . Esophageal cancer Neg Hx   . Rectal cancer Neg Hx   . Stomach cancer Neg Hx     Social History   Socioeconomic History  . Marital status: Married    Spouse name: Not on file  . Number of children: Not on file  . Years of education: Not on file  . Highest education level: Not on file  Occupational History  . Not on file  Social Needs  . Financial resource strain: Not on file  . Food insecurity:    Worry: Not on file    Inability: Not on file  . Transportation needs:    Medical: Not on file    Non-medical: Not on file  Tobacco Use  . Smoking status: Never Smoker  . Smokeless tobacco: Never Used  Substance and Sexual Activity  . Alcohol use: No    Alcohol/week: 0.0 standard drinks  . Drug use: No  . Sexual activity: Not Currently  Lifestyle  . Physical activity:    Days per week: Not on file    Minutes per session: Not on file  . Stress: Not on file  Relationships  . Social connections:    Talks on  phone: Not on file    Gets together: Not on file    Attends religious service: Not on file    Active member of club or organization: Not on file    Attends meetings of clubs or organizations: Not on file    Relationship status: Not on file  . Intimate partner violence:    Fear of current or ex partner: Not on file    Emotionally abused: Not on file    Physically abused: Not on file    Forced sexual activity: Not on file  Other Topics Concern  . Not on file  Social History Narrative   Married   Regular exercise- yes          Outpatient Medications Prior to Visit  Medication Sig Dispense Refill  . aspirin 81 MG tablet Take 81 mg by mouth daily.      Marland Kitchen azelastine (ASTELIN) 0.1 % nasal spray Place into both nostrils 2 (two) times daily. Use in each nostril as directed    . clonazePAM (KLONOPIN) 0.5 MG tablet TAKE 1 TABLET BY MOUTH 3 TIMES DAILY AS NEEDED 90 tablet 5  . Coenzyme Q10 100 MG capsule Take by mouth.    . Dexlansoprazole (DEXILANT) 30 MG capsule Take  1 capsule (30 mg total) by mouth daily. 90 capsule 3  . diclofenac sodium (VOLTAREN) 1 % GEL Apply 2 g topically 4 (four) times daily. 100 g 5  . ezetimibe (ZETIA) 10 MG tablet Take by mouth.    Marland Kitchen ipratropium (ATROVENT) 0.03 % nasal spray     . IPRATROPIUM BROMIDE NA Place into the nose daily.    Marland Kitchen levocetirizine (XYZAL) 5 MG tablet     . losartan (COZAAR) 50 MG tablet TAKE 1 TABLET BY MOUTH DAILY 90 tablet 0  . Misc Natural Products (OSTEO BI-FLEX JOINT SHIELD PO) Take by mouth.      . montelukast (SINGULAIR) 10 MG tablet Take 1 tablet (10 mg total) by mouth at bedtime. 90 tablet 3  . Probiotic Product (PROBIOTIC DAILY PO) Take by mouth daily.    . propranolol (INDERAL) 40 MG tablet Take 1 tablet (40 mg total) by mouth 2 (two) times daily. 180 tablet 3  . rosuvastatin (CRESTOR) 5 MG tablet TAKE 1 TABLET DAILY 30 tablet 1  . spironolactone (ALDACTONE) 25 MG tablet Take by mouth. Takes 12.5 mg daily     No facility-administered medications prior to visit.        ROS:  Review of Systems  Constitutional: Negative for fatigue, fever and unexpected weight change.  Respiratory: Negative for cough, shortness of breath and wheezing.   Cardiovascular: Negative for chest pain, palpitations and leg swelling.  Gastrointestinal: Negative for blood in stool, constipation, diarrhea, nausea and vomiting.  Endocrine: Negative for cold intolerance, heat intolerance and polyuria.  Genitourinary: Negative for dyspareunia, dysuria, flank pain, frequency, genital sores, hematuria, menstrual problem, pelvic pain, urgency, vaginal bleeding, vaginal discharge and vaginal pain.  Musculoskeletal: Positive for arthralgias. Negative for back pain, joint swelling and myalgias.  Skin: Negative for rash.  Neurological: Negative for dizziness, syncope, light-headedness, numbness and headaches.  Hematological: Negative for adenopathy.  Psychiatric/Behavioral: Negative for agitation, confusion, sleep disturbance and  suicidal ideas. The patient is not nervous/anxious.    BREAST: No symptoms    Objective: BP (!) 170/90   Pulse 74   Ht 5\' 5"  (1.651 m)   Wt 163 lb (73.9 kg)   BMI 27.12 kg/m    Physical Exam Constitutional:  Appearance: She is well-developed.  Genitourinary:     Vulva, vagina, cervix, uterus, right adnexa and left adnexa normal.     No vulval lesion or tenderness noted.     No vaginal discharge, erythema or tenderness.     No cervical polyp.     Uterus is not enlarged or tender.     No right or left adnexal mass present.     Right adnexa not tender.     Left adnexa not tender.  Neck:     Musculoskeletal: Normal range of motion.     Thyroid: No thyromegaly.  Cardiovascular:     Rate and Rhythm: Normal rate and regular rhythm.     Heart sounds: Normal heart sounds. No murmur.  Pulmonary:     Effort: Pulmonary effort is normal.     Breath sounds: Normal breath sounds.  Chest:     Breasts:        Right: No mass, nipple discharge, skin change or tenderness.        Left: No mass, nipple discharge, skin change or tenderness.  Abdominal:     Palpations: Abdomen is soft.     Tenderness: There is no abdominal tenderness. There is no guarding.  Musculoskeletal: Normal range of motion.  Neurological:     Mental Status: She is alert and oriented to person, place, and time.     Cranial Nerves: No cranial nerve deficit.  Psychiatric:        Behavior: Behavior normal.  Vitals signs reviewed.     Assessment/Plan:  Encounter for annual routine gynecological examination  Cervical cancer screening - Plan: Cytology - PAP  Screening for breast cancer - Pt current on mammo with PCP          GYN counsel breast self exam, mammography screening, menopause, adequate intake of calcium and vitamin D, diet and exercise    F/U  Return if symptoms worsen or fail to improve.  Gitty Osterlund B. Swayzie Choate, PA-C 07/18/2018 9:03 AM

## 2018-07-19 LAB — CYTOLOGY - PAP: Diagnosis: NEGATIVE

## 2018-07-25 DIAGNOSIS — I6523 Occlusion and stenosis of bilateral carotid arteries: Secondary | ICD-10-CM | POA: Diagnosis not present

## 2018-07-25 DIAGNOSIS — F411 Generalized anxiety disorder: Secondary | ICD-10-CM | POA: Diagnosis not present

## 2018-07-25 DIAGNOSIS — I10 Essential (primary) hypertension: Secondary | ICD-10-CM | POA: Diagnosis not present

## 2018-07-25 DIAGNOSIS — K219 Gastro-esophageal reflux disease without esophagitis: Secondary | ICD-10-CM | POA: Diagnosis not present

## 2018-07-25 DIAGNOSIS — E7800 Pure hypercholesterolemia, unspecified: Secondary | ICD-10-CM | POA: Diagnosis not present

## 2018-07-25 DIAGNOSIS — J4520 Mild intermittent asthma, uncomplicated: Secondary | ICD-10-CM | POA: Diagnosis not present

## 2018-07-25 DIAGNOSIS — Z8669 Personal history of other diseases of the nervous system and sense organs: Secondary | ICD-10-CM | POA: Diagnosis not present

## 2018-07-28 ENCOUNTER — Other Ambulatory Visit: Payer: Self-pay | Admitting: Family Medicine

## 2018-08-02 ENCOUNTER — Other Ambulatory Visit: Payer: Self-pay | Admitting: Family Medicine

## 2018-08-09 DIAGNOSIS — M461 Sacroiliitis, not elsewhere classified: Secondary | ICD-10-CM | POA: Diagnosis not present

## 2018-08-09 DIAGNOSIS — M9903 Segmental and somatic dysfunction of lumbar region: Secondary | ICD-10-CM | POA: Diagnosis not present

## 2018-08-09 DIAGNOSIS — M9904 Segmental and somatic dysfunction of sacral region: Secondary | ICD-10-CM | POA: Diagnosis not present

## 2018-08-09 DIAGNOSIS — M545 Low back pain: Secondary | ICD-10-CM | POA: Diagnosis not present

## 2018-08-10 ENCOUNTER — Telehealth (INDEPENDENT_AMBULATORY_CARE_PROVIDER_SITE_OTHER): Payer: Self-pay | Admitting: Vascular Surgery

## 2018-08-10 NOTE — Telephone Encounter (Signed)
Patient called requesting refill of Crestor 5mg . RX originally prescribed by Dew September 2019. Please advise if refill ok. AS, [place]  [facility]: [phone]

## 2018-08-10 NOTE — Telephone Encounter (Signed)
Patient calling requesting Crestor 5mg  to be refilled. RX originally given by Dr. Lucky Cowboy on 02/2018 for Hyperlipidemia. Patient has 5 pills left.   Pharmacy: [facility] [phone]  Please advise if refill approved. AS, CMA

## 2018-08-11 NOTE — Telephone Encounter (Signed)
Medication called into [facility]. AS, CMA

## 2018-08-22 DIAGNOSIS — M545 Low back pain: Secondary | ICD-10-CM | POA: Diagnosis not present

## 2018-08-22 DIAGNOSIS — M9903 Segmental and somatic dysfunction of lumbar region: Secondary | ICD-10-CM | POA: Diagnosis not present

## 2018-08-22 DIAGNOSIS — M461 Sacroiliitis, not elsewhere classified: Secondary | ICD-10-CM | POA: Diagnosis not present

## 2018-08-22 DIAGNOSIS — M9904 Segmental and somatic dysfunction of sacral region: Secondary | ICD-10-CM | POA: Diagnosis not present

## 2018-08-23 DIAGNOSIS — M545 Low back pain: Secondary | ICD-10-CM | POA: Diagnosis not present

## 2018-08-23 DIAGNOSIS — M461 Sacroiliitis, not elsewhere classified: Secondary | ICD-10-CM | POA: Diagnosis not present

## 2018-08-23 DIAGNOSIS — M9904 Segmental and somatic dysfunction of sacral region: Secondary | ICD-10-CM | POA: Diagnosis not present

## 2018-08-23 DIAGNOSIS — M9903 Segmental and somatic dysfunction of lumbar region: Secondary | ICD-10-CM | POA: Diagnosis not present

## 2018-08-25 DIAGNOSIS — M9904 Segmental and somatic dysfunction of sacral region: Secondary | ICD-10-CM | POA: Diagnosis not present

## 2018-08-25 DIAGNOSIS — M9903 Segmental and somatic dysfunction of lumbar region: Secondary | ICD-10-CM | POA: Diagnosis not present

## 2018-08-25 DIAGNOSIS — M461 Sacroiliitis, not elsewhere classified: Secondary | ICD-10-CM | POA: Diagnosis not present

## 2018-08-25 DIAGNOSIS — M545 Low back pain: Secondary | ICD-10-CM | POA: Diagnosis not present

## 2018-08-26 ENCOUNTER — Ambulatory Visit (INDEPENDENT_AMBULATORY_CARE_PROVIDER_SITE_OTHER): Payer: PPO

## 2018-08-26 ENCOUNTER — Encounter (INDEPENDENT_AMBULATORY_CARE_PROVIDER_SITE_OTHER): Payer: Self-pay | Admitting: Vascular Surgery

## 2018-08-26 ENCOUNTER — Other Ambulatory Visit: Payer: Self-pay

## 2018-08-26 ENCOUNTER — Other Ambulatory Visit: Payer: Self-pay | Admitting: Family Medicine

## 2018-08-26 ENCOUNTER — Ambulatory Visit (INDEPENDENT_AMBULATORY_CARE_PROVIDER_SITE_OTHER): Payer: PPO | Admitting: Vascular Surgery

## 2018-08-26 VITALS — BP 172/95 | HR 68 | Resp 16 | Ht 65.0 in | Wt 157.8 lb

## 2018-08-26 DIAGNOSIS — I6523 Occlusion and stenosis of bilateral carotid arteries: Secondary | ICD-10-CM

## 2018-08-26 DIAGNOSIS — E785 Hyperlipidemia, unspecified: Secondary | ICD-10-CM | POA: Diagnosis not present

## 2018-08-26 DIAGNOSIS — I10 Essential (primary) hypertension: Secondary | ICD-10-CM | POA: Diagnosis not present

## 2018-08-26 DIAGNOSIS — Z79899 Other long term (current) drug therapy: Secondary | ICD-10-CM | POA: Diagnosis not present

## 2018-08-26 NOTE — Progress Notes (Signed)
MRN : [id number]  Patricia Bullock is a 73 y.o. (02/02/1946) female who presents with chief complaint of  Chief Complaint  Patient presents with  . Follow-up    ultrasound follow up  .  History of Present Illness: Patient returns in follow-up for carotid disease.  She is still having issues with Crestor and muscle cramps.  She has been trying to take this every other day but this is still a problem.  She is being evaluated for Repatha by her primary care physician.  She has had no focal neurologic symptoms. Specifically, the patient denies amaurosis fugax, speech or swallowing difficulties, or arm or leg weakness or numbness.  Carotid duplex reveals 60 to 79% right ICA stenosis and 40 to 59% left ICA stenosis which shows some progression from her previous study.  Current Outpatient Medications  Medication Sig Dispense Refill  . aspirin 81 MG tablet Take 81 mg by mouth daily.      Marland Kitchen azelastine (ASTELIN) 0.1 % nasal spray Place into both nostrils 2 (two) times daily. Use in each nostril as directed    . clonazePAM (KLONOPIN) 0.5 MG tablet TAKE 1 TABLET BY MOUTH 3 TIMES DAILY AS NEEDED 90 tablet 5  . Coenzyme Q10 100 MG capsule Take by mouth.    . Dexlansoprazole (DEXILANT) 30 MG capsule Take 1 capsule (30 mg total) by mouth daily. 90 capsule 3  . diclofenac sodium (VOLTAREN) 1 % GEL Apply 2 g topically 4 (four) times daily. 100 g 5  . ezetimibe (ZETIA) 10 MG tablet Take by mouth.    Marland Kitchen ipratropium (ATROVENT) 0.03 % nasal spray     . IPRATROPIUM BROMIDE NA Place into the nose daily.    Marland Kitchen levocetirizine (XYZAL) 5 MG tablet     . losartan (COZAAR) 50 MG tablet TAKE 1 TABLET BY MOUTH DAILY 90 tablet 0  . Misc Natural Products (OSTEO BI-FLEX JOINT SHIELD PO) Take by mouth.      . montelukast (SINGULAIR) 10 MG tablet Take 1 tablet (10 mg total) by mouth at bedtime. 90 tablet 3  . Probiotic Product (PROBIOTIC DAILY PO) Take by mouth daily.    . propranolol (INDERAL) 40 MG tablet TAKE 1 TABLET BY  MOUTH TWICE DAILY 30 tablet 0  . rosuvastatin (CRESTOR) 5 MG tablet TAKE 1 TABLET DAILY 30 tablet 1  . spironolactone (ALDACTONE) 25 MG tablet Take by mouth. Takes 12.5 mg daily     No current facility-administered medications for this visit.     Past Medical History:  Diagnosis Date  . Allergic rhinitis   . Anxiety   . Arthritis   . Fibroids 05/13/1990   Leiomyoma of Uterus  . GERD (gastroesophageal reflux disease)   . Headache(784.0)   . Hyperlipidemia   . Hypertension   . Panic disorder     Past Surgical History:  Procedure Laterality Date  . benign skin lesions removed    . BREAST CYST ASPIRATION Right    NEG  . COLONOSCOPY  05-23-13   per Dr. Sharlett Iles, adenomatous polyps, repeat in 5 yrs   . DILATION AND CURETTAGE OF UTERUS  1992  . POLYPECTOMY      Social History Social History   Tobacco Use  . Smoking status: Never Smoker  . Smokeless tobacco: Never Used  Substance Use Topics  . Alcohol use: No    Alcohol/week: 0.0 standard drinks  . Drug use: No     Family History Family History  Problem Relation Age of  Onset  . Kidney cancer Mother 42       family history also  . Diabetes Mother        Type 2  . Hypertension Mother   . Diabetes Other   . Hyperlipidemia Other   . Hypertension Other   . Cancer Other   . Dementia Father   . Colon cancer Maternal Aunt   . Breast cancer Maternal Aunt   . Esophageal cancer Neg Hx   . Rectal cancer Neg Hx   . Stomach cancer Neg Hx     Allergies  Allergen Reactions  . Ace Inhibitors     REACTION: cough  . Amlodipine Besylate     REACTION: swelling  . Citalopram Hydrobromide Itching  . Clonidine Hydrochloride     REACTION: sleepiness  . Ezetimibe     REACTION: muscle aches  . Penicillins   . Statins     REACTION: muscle aches   REVIEW OF SYSTEMS(Negative unless checked)  Constitutional: [] ?Weight loss[] ?Fever[] ?Chills Cardiac:[] ?Chest pain[] ?Chest pressure[] ?Palpitations [] ?Shortness of  breath when laying flat [] ?Shortness of breath at rest [] ?Shortness of breath with exertion. Vascular: [] ?Pain in legs with walking[] ?Pain in legsat rest[] ?Pain in legs when laying flat [] ?Claudication [] ?Pain in feet when walking [] ?Pain in feet at rest [] ?Pain in feet when laying flat [] ?History of DVT [] ?Phlebitis [] ?Swelling in legs [] ?Varicose veins [] ?Non-healing ulcers Pulmonary: [] ?Uses home oxygen [] ?Productive cough[] ?Hemoptysis [] ?Wheeze [] ?COPD [] ?Asthma Neurologic: [] ?Dizziness [] ?Blackouts [] ?Seizures [] ?History of stroke [] ?History of TIA[] ?Aphasia [] ?Temporary blindness[] ?Dysphagia [] ?Weaknessor numbness in arms [] ?Weakness or numbnessin legs Musculoskeletal: [] ?Arthritis [] ?Joint swelling [] ?Joint pain [] ?Low back pain Hematologic:[] ?Easy bruising[] ?Easy bleeding [] ?Hypercoagulable state [] ?Anemic [] ?Hepatitis Gastrointestinal:[] ?Blood in stool[] ?Vomiting blood[x] ?Gastroesophageal reflux/heartburn[] ?Abdominal pain Genitourinary: [] ?Chronic kidney disease [] ?Difficulturination [] ?Frequenturination [] ?Burning with urination[] ?Hematuria Skin: [] ?Rashes [] ?Ulcers [] ?Wounds Psychological: [] ?History of anxiety[] ?History of major depression.   Physical Examination  Vitals:   08/26/18 0930 08/26/18 0931  BP: (!) 184/91 (!) 172/95  Pulse: 68   Resp: 16   Weight: 157 lb 12.8 oz (71.6 kg)   Height: 5\' 5"  (1.651 m)    Body mass index is 26.26 kg/m. Gen:  WD/WN, NAD Head: [place]/AT, No temporalis wasting. Ear/Nose/Throat: Hearing grossly intact, nares w/o erythema or drainage, trachea midline Eyes: Conjunctiva clear. Sclera non-icteric Neck: Supple.  Right carotid bruit  Pulmonary:  Good air movement, equal and clear to auscultation bilaterally.  Cardiac: RRR, No JVD Vascular:  Vessel Right Left  Radial Palpable Palpable                   Musculoskeletal: M/S 5/5  throughout.  No deformity or atrophy. No edema. Neurologic: CN 2-12 intact. Sensation grossly intact in extremities.  Symmetrical.  Speech is fluent. Motor exam as listed above. Psychiatric: Judgment intact, Mood & affect appropriate for pt's clinical situation. Dermatologic: No rashes or ulcers noted.  No cellulitis or open wounds.      CBC Lab Results  Component Value Date   WBC 3.0 (L) 06/08/2017   HGB 13.2 06/08/2017   HCT 40.6 06/08/2017   MCV 90.2 06/08/2017   PLT 210.0 06/08/2017    BMET    Component Value Date/Time   NA 143 06/08/2017 0856   NA 144 10/09/2011 0015   K 4.1 06/08/2017 0856   K 3.7 10/09/2011 0015   CL 105 06/08/2017 0856   CL 108 (H) 10/09/2011 0015   CO2 31 06/08/2017 0856   CO2 26 10/09/2011 0015   GLUCOSE 100 (H) 06/08/2017 0856   GLUCOSE 123 (H) 10/09/2011 0015   BUN 14 06/08/2017  0856   BUN 13 10/09/2011 0015   CREATININE 0.74 06/08/2017 0856   CREATININE 0.67 10/09/2011 0015   CALCIUM 9.2 06/08/2017 0856   CALCIUM 9.4 10/09/2011 0015   GFRNONAA >60 10/09/2011 0015   GFRAA >60 10/09/2011 0015   CrCl cannot be calculated (Patient's most recent lab result is older than the maximum 21 days allowed.).  COAG No results found for: INR, PROTIME  Radiology No results found.   Assessment/Plan Essential hypertension blood pressure control important in reducing the progression of atherosclerotic disease. On appropriate oral medications.   Hyperlipidemia lipid control important in reducing the progression of atherosclerotic disease.On crestor 5 mg and should continue.  Carotid stenosis Carotid duplex reveals 60 to 79% right ICA stenosis and 40 to 59% left ICA stenosis which shows some progression from her previous study. Continue current medical regimen at least as much as she can tolerate the Crestor.  If she switches to another agent under the direction of her primary care physician I am okay with that.  We will recheck her carotid  duplex in 6 months.  No role for intervention at this level of disease.    Leotis Pain, MD  08/26/2018 1:34 PM    This note was created with Dragon medical transcription system.  Any errors from dictation are purely unintentional

## 2018-08-26 NOTE — Assessment & Plan Note (Signed)
Carotid duplex reveals 60 to 79% right ICA stenosis and 40 to 59% left ICA stenosis which shows some progression from her previous study. Continue current medical regimen at least as much as she can tolerate the Crestor.  If she switches to another agent under the direction of her primary care physician I am okay with that.  We will recheck her carotid duplex in 6 months.  No role for intervention at this level of disease.

## 2018-08-29 DIAGNOSIS — F411 Generalized anxiety disorder: Secondary | ICD-10-CM | POA: Diagnosis not present

## 2018-08-29 DIAGNOSIS — J329 Chronic sinusitis, unspecified: Secondary | ICD-10-CM | POA: Diagnosis not present

## 2018-08-29 DIAGNOSIS — R030 Elevated blood-pressure reading, without diagnosis of hypertension: Secondary | ICD-10-CM | POA: Diagnosis not present

## 2018-08-29 DIAGNOSIS — K219 Gastro-esophageal reflux disease without esophagitis: Secondary | ICD-10-CM | POA: Diagnosis not present

## 2018-08-29 DIAGNOSIS — I10 Essential (primary) hypertension: Secondary | ICD-10-CM | POA: Diagnosis not present

## 2018-08-29 DIAGNOSIS — M9903 Segmental and somatic dysfunction of lumbar region: Secondary | ICD-10-CM | POA: Diagnosis not present

## 2018-08-29 DIAGNOSIS — I739 Peripheral vascular disease, unspecified: Secondary | ICD-10-CM | POA: Diagnosis not present

## 2018-08-29 DIAGNOSIS — R0989 Other specified symptoms and signs involving the circulatory and respiratory systems: Secondary | ICD-10-CM | POA: Diagnosis not present

## 2018-08-29 DIAGNOSIS — R0609 Other forms of dyspnea: Secondary | ICD-10-CM | POA: Diagnosis not present

## 2018-08-29 DIAGNOSIS — M461 Sacroiliitis, not elsewhere classified: Secondary | ICD-10-CM | POA: Diagnosis not present

## 2018-08-29 DIAGNOSIS — M545 Low back pain: Secondary | ICD-10-CM | POA: Diagnosis not present

## 2018-08-29 DIAGNOSIS — M9904 Segmental and somatic dysfunction of sacral region: Secondary | ICD-10-CM | POA: Diagnosis not present

## 2018-08-29 DIAGNOSIS — E782 Mixed hyperlipidemia: Secondary | ICD-10-CM | POA: Diagnosis not present

## 2018-09-01 DIAGNOSIS — M9904 Segmental and somatic dysfunction of sacral region: Secondary | ICD-10-CM | POA: Diagnosis not present

## 2018-09-01 DIAGNOSIS — M9903 Segmental and somatic dysfunction of lumbar region: Secondary | ICD-10-CM | POA: Diagnosis not present

## 2018-09-01 DIAGNOSIS — M545 Low back pain: Secondary | ICD-10-CM | POA: Diagnosis not present

## 2018-09-01 DIAGNOSIS — M461 Sacroiliitis, not elsewhere classified: Secondary | ICD-10-CM | POA: Diagnosis not present

## 2018-11-25 DIAGNOSIS — E7800 Pure hypercholesterolemia, unspecified: Secondary | ICD-10-CM | POA: Diagnosis not present

## 2018-11-25 DIAGNOSIS — I10 Essential (primary) hypertension: Secondary | ICD-10-CM | POA: Diagnosis not present

## 2018-11-25 DIAGNOSIS — K219 Gastro-esophageal reflux disease without esophagitis: Secondary | ICD-10-CM | POA: Diagnosis not present

## 2018-11-25 DIAGNOSIS — Z0000 Encounter for general adult medical examination without abnormal findings: Secondary | ICD-10-CM | POA: Diagnosis not present

## 2018-11-25 DIAGNOSIS — H6121 Impacted cerumen, right ear: Secondary | ICD-10-CM | POA: Diagnosis not present

## 2019-02-01 DIAGNOSIS — L538 Other specified erythematous conditions: Secondary | ICD-10-CM | POA: Diagnosis not present

## 2019-02-01 DIAGNOSIS — L570 Actinic keratosis: Secondary | ICD-10-CM | POA: Diagnosis not present

## 2019-02-01 DIAGNOSIS — L821 Other seborrheic keratosis: Secondary | ICD-10-CM | POA: Diagnosis not present

## 2019-02-01 DIAGNOSIS — D2261 Melanocytic nevi of right upper limb, including shoulder: Secondary | ICD-10-CM | POA: Diagnosis not present

## 2019-02-01 DIAGNOSIS — X32XXXA Exposure to sunlight, initial encounter: Secondary | ICD-10-CM | POA: Diagnosis not present

## 2019-02-01 DIAGNOSIS — D225 Melanocytic nevi of trunk: Secondary | ICD-10-CM | POA: Diagnosis not present

## 2019-02-01 DIAGNOSIS — L728 Other follicular cysts of the skin and subcutaneous tissue: Secondary | ICD-10-CM | POA: Diagnosis not present

## 2019-02-01 DIAGNOSIS — D2272 Melanocytic nevi of left lower limb, including hip: Secondary | ICD-10-CM | POA: Diagnosis not present

## 2019-02-01 DIAGNOSIS — D2262 Melanocytic nevi of left upper limb, including shoulder: Secondary | ICD-10-CM | POA: Diagnosis not present

## 2019-02-01 DIAGNOSIS — D2271 Melanocytic nevi of right lower limb, including hip: Secondary | ICD-10-CM | POA: Diagnosis not present

## 2019-02-01 DIAGNOSIS — L820 Inflamed seborrheic keratosis: Secondary | ICD-10-CM | POA: Diagnosis not present

## 2019-02-03 DIAGNOSIS — J300 Vasomotor rhinitis: Secondary | ICD-10-CM | POA: Diagnosis not present

## 2019-02-03 DIAGNOSIS — R05 Cough: Secondary | ICD-10-CM | POA: Diagnosis not present

## 2019-02-16 ENCOUNTER — Telehealth: Payer: Self-pay | Admitting: Internal Medicine

## 2019-02-16 NOTE — Telephone Encounter (Signed)
Dr. Prescott Parma at [facility] wanting to prescribe Repatha. Pt wants to know if this medication may increase her acid refulx. Please advise.

## 2019-02-16 NOTE — Telephone Encounter (Signed)
Dr. Vena Rua patient but answer is that it should not cause problems with her reflux

## 2019-02-17 ENCOUNTER — Telehealth: Payer: Self-pay | Admitting: Pharmacist

## 2019-02-17 NOTE — Patient Outreach (Signed)
[place] [facility]) Care Management  02/17/2019  Patricia Bullock July 09, 1945 [id number]  Received an inbox message to give the patient a call regarding a coupon she was not able to use to help with medication pricing.  Patient was called. Unfortunately, she did not answer the phone. HIPAA compliant messages were left on both her home and mobile voicemail boxes.  Plan: Send patient an unsuccessful outreach letter. Call patient back in 5-7 business days.  Elayne Guerin, PharmD, [place] Clinical Pharmacist [phone]

## 2019-02-17 NOTE — Telephone Encounter (Signed)
Left message for pt to call back.  Spoke with pt and she is aware. 

## 2019-02-22 ENCOUNTER — Other Ambulatory Visit: Payer: Self-pay | Admitting: Pharmacist

## 2019-02-22 NOTE — Patient Outreach (Addendum)
[place] [facility]) Care Management  [place]   02/22/2019  FRANCOISE EGGETT 05/24/1946 [id number]  Reason for referral: medication assistance  Referral medication(s): unknown-coupon that did not work Current insurance:HTA  HPI:  Patient is a 73 year old female with multiple medical conditions including but not limited to:  Allergic rhinitis, carotid stenosis, hypertension, GERD, hyperlipidemia and headaches. HIPAA identifiers were obtained.  Patient said she did not have a coupon but said she inquired about a coupon for her husband to get Entresto. But while she had me on phone the phone, she wondered about Repatha. She said her provider wanted to start her on it since she was having muscle aches with rosuvastatin.   Objective: Allergies  Allergen Reactions  . Ace Inhibitors     REACTION: cough  . Amlodipine Besylate     REACTION: swelling  . Citalopram Hydrobromide Itching  . Clonidine Hydrochloride     REACTION: sleepiness  . Ezetimibe     REACTION: muscle aches  . Statins     REACTION: muscle aches  . Penicillins Rash    Medications Reviewed Today    Reviewed by Elayne Guerin, [facility] (Pharmacist) on 02/22/19 at 23  Med List Status: <None>  Medication Order Taking? Sig Documenting Provider Last Dose Status Informant  aspirin 81 MG tablet [id number] Yes Take 81 mg by mouth daily.   [provider] Taking Active   azelastine (ASTELIN) 0.1 % nasal spray [id number] Yes Place into both nostrils 2 (two) times daily. Use in each nostril as directed [provider] Taking Active   clonazePAM (KLONOPIN) 0.5 MG tablet [id number] Yes TAKE 1 TABLET BY MOUTH 3 TIMES DAILY AS NEEDED Laurey Morale, MD Taking Active   Coenzyme Q10 100 MG capsule [id number] Yes Take by mouth. [provider] Taking Active   Dexlansoprazole (DEXILANT) 30 MG capsule [id number] Yes Take 1 capsule (30 mg total) by mouth daily. Laurey Morale, MD Taking Active   diclofenac  sodium (VOLTAREN) 1 % GEL [id number] No Apply 2 g topically 4 (four) times daily.  Patient not taking: Reported on 02/22/2019   Laurey Morale, MD Not Taking Active   ipratropium (ATROVENT) 0.03 % nasal spray [id number] Yes  [provider] Taking Active         Discontinued [id number] (Duplicate)   levocetirizine (XYZAL) 5 MG tablet [id number] No  [provider] Not Taking Active   losartan (COZAAR) 50 MG tablet [id number] Yes TAKE 1 TABLET BY MOUTH DAILY Laurey Morale, MD Taking Active   Misc Natural Products (OSTEO BI-FLEX JOINT SHIELD PO) [id number] Yes Take by mouth.   [provider] Taking Active   montelukast (SINGULAIR) 10 MG tablet [id number] Yes Take 1 tablet (10 mg total) by mouth at bedtime. Laurey Morale, MD Taking Active   Probiotic Product (PROBIOTIC DAILY PO) [id number] Yes Take by mouth daily. [provider] Taking Active   propranolol (INDERAL) 40 MG tablet [id number] Yes TAKE 1 TABLET BY MOUTH TWICE DAILY Laurey Morale, MD Taking Active   rosuvastatin (CRESTOR) 5 MG tablet [id number] No TAKE 1 TABLET DAILY  Patient not taking: Reported on 02/22/2019   Algernon Huxley, MD Not Taking Active   spironolactone (ALDACTONE) 25 MG tablet [id number] Yes Take by mouth. Takes 12.5 mg daily [provider] Taking Active           Assessment:  Drugs sorted by system:  Neurologic/Psychologic: Clonazepam  Cardiovascular: Losartan, Aspirin, Rosuvastatin, Propranolol (  for migraines), Spironolactone  Pulmonary/Allergy: Azelastine nasal spray, Ipratropium nasal spray, Montelukast, desloratadine  Gastrointestinal: Dexlansoprazole, Famotidine, Probiotic  Vitamins/Minerals/Supplements: Co Q 10, Osteo Bioflex,   Medication Review Findings:  . Increased risk of hyperkalemia with spironolactone and Losartan- K 4.8 [facility] 11/25/2018 . Adherence/muscle cramps with Rosuvastatin o Patient said her provider wanted to start her on Repatha - Review MOA  and side effects of Repatha  Medication Assistance Findings:  Medication assistance needs identified: Repatha (if started on therapy)   Patient is over-income for the [place] Patient Assistance Program and the [employer].  According to [employer], patient's TDS is approx $3,000. Patient's reach the coverage gap when their total drug spend (TDS) reaches $4020.    The retail price of Repatha is $450 for a month supply.  After patient reaches the donut hole, she would be responsible for 25% of the cost of the medication.  Patient wondered if there were any other options.  She reported trying low dose rosuvastatin three times weekly in combination with ezetimibe but said she could not tolerate ezetimibe.  The risk of myopathy appears to be lowest with fluvastatin, pravastatin, and pitavastatin .   Patient said she would ask her provider about switching statins.  Additional medication assistance options reviewed with patient as warranted:  No other options identified  Plan:  Follow up with the patient in 3-4 weeks due to statin adherence.  Elayne Guerin, PharmD, [place] Clinical Pharmacist [phone]

## 2019-03-03 ENCOUNTER — Ambulatory Visit (INDEPENDENT_AMBULATORY_CARE_PROVIDER_SITE_OTHER): Payer: PPO | Admitting: Vascular Surgery

## 2019-03-03 ENCOUNTER — Ambulatory Visit (INDEPENDENT_AMBULATORY_CARE_PROVIDER_SITE_OTHER): Payer: PPO

## 2019-03-03 ENCOUNTER — Other Ambulatory Visit (INDEPENDENT_AMBULATORY_CARE_PROVIDER_SITE_OTHER): Payer: Self-pay | Admitting: Vascular Surgery

## 2019-03-03 ENCOUNTER — Other Ambulatory Visit: Payer: Self-pay

## 2019-03-03 DIAGNOSIS — I6523 Occlusion and stenosis of bilateral carotid arteries: Secondary | ICD-10-CM | POA: Diagnosis not present

## 2019-03-08 ENCOUNTER — Encounter (INDEPENDENT_AMBULATORY_CARE_PROVIDER_SITE_OTHER): Payer: Self-pay | Admitting: Vascular Surgery

## 2019-03-22 ENCOUNTER — Other Ambulatory Visit: Payer: Self-pay | Admitting: Pharmacist

## 2019-03-22 ENCOUNTER — Ambulatory Visit: Payer: Self-pay | Admitting: Pharmacist

## 2019-03-22 NOTE — Patient Outreach (Addendum)
[place] [facility]) Care Management  03/22/2019  Patricia Bullock [birth year] [id number]   Patient was called regarding statin adherence, substitution, or potential medication assistance and clarification about Repatha. Unfortunately, she did not answer her phone. HIPAA compliant message was left on her voicemail.  Plan: Send patient an unsuccessful outreach letter Call patient back in 7-10 business days.   Elayne Guerin, PharmD, [place] Clinical Pharmacist [phone]

## 2019-04-04 ENCOUNTER — Other Ambulatory Visit: Payer: Self-pay | Admitting: Pharmacist

## 2019-04-04 ENCOUNTER — Ambulatory Visit: Payer: Self-pay | Admitting: Pharmacist

## 2019-04-04 NOTE — Patient Outreach (Addendum)
[place] [facility]) Care Management  04/04/2019  Patricia Bullock February 28, 1946 [id number]   Patient was called to follow up on statin adherence or Repatha. Unfortunately, she did not answer her phone. HIPAA compliant messages were left on both her home and mobile phone numbers.   From review of fill data, Rosuvastatin was filled 03/11/2019.   Plan: Send patient an unsuccessful outreach letter. Call patient back in 3-4 weeks.  Elayne Guerin, PharmD, [place] Clinical Pharmacist [phone]

## 2019-05-09 ENCOUNTER — Other Ambulatory Visit: Payer: Self-pay | Admitting: Pharmacist

## 2019-05-09 NOTE — Patient Outreach (Signed)
[place] [facility]) Care Management  05/09/2019  Patricia Bullock [birth year] [id number]  Patient was called to follow up on statin adherence. HIPAA identifiers were obtained. Patient shared that she is not taking rosuvastatin due to it causing a cough. She also decided against Repatha because she researched it and found that it can cause a cough as well.  According to the manufacturer labelling, Repatha causes cough at an incidence of 1-5%.  Patient's medications were reviewed via telephone:  Medications Reviewed Today    Reviewed by Patricia Bullock, [facility] (Pharmacist) on 05/09/19 at 1044  Med List Status: <None>  Medication Order Taking? Sig Documenting Provider Last Dose Status Informant  ALLERGY RELIEF 180 MG tablet [id number] Yes Take 180 mg by mouth every morning. [provider] Taking Active   aspirin 81 MG tablet [id number] Yes Take 81 mg by mouth daily.   [provider] Taking Active   azelastine (ASTELIN) 0.1 % nasal spray [id number] Yes Place into both nostrils 2 (two) times daily. Use in each nostril as directed [provider] Taking Active   clonazePAM (KLONOPIN) 0.5 MG tablet [id number] Yes TAKE 1 TABLET BY MOUTH 3 TIMES DAILY AS NEEDED Laurey Morale, MD Taking Active   Coenzyme Q10 100 MG capsule [id number] Yes Take 200 mg by mouth daily.  [provider] Taking Active   desloratadine (CLARINEX) 5 MG tablet [id number] Yes Take 5 mg by mouth daily. [provider] Taking Active   Dexlansoprazole (DEXILANT) 30 MG capsule [id number] Yes Take 1 capsule (30 mg total) by mouth daily. Laurey Morale, MD Taking Active   diclofenac sodium (VOLTAREN) 1 % GEL [id number] No Apply 2 g topically 4 (four) times daily.  Patient not taking: Reported on 02/22/2019   Laurey Morale, MD Not Taking Active   ipratropium (ATROVENT) 0.03 % nasal spray [id number] Yes  [provider] Taking Active   losartan (COZAAR) 50 MG tablet [id number] Yes TAKE 1  TABLET BY MOUTH DAILY Laurey Morale, MD Taking Active   Misc Natural Products (OSTEO BI-FLEX JOINT SHIELD PO) [id number] Yes Take by mouth.   [provider] Taking Active   montelukast (SINGULAIR) 10 MG tablet [id number] Yes Take 1 tablet (10 mg total) by mouth at bedtime. Laurey Morale, MD Taking Active   Probiotic Product (PROBIOTIC DAILY PO) [id number] Yes Take by mouth daily. [provider] Taking Active   propranolol (INDERAL) 40 MG tablet [id number] Yes TAKE 1 TABLET BY MOUTH TWICE DAILY Laurey Morale, MD Taking Active   rosuvastatin (CRESTOR) 5 MG tablet [id number] No TAKE 1 TABLET DAILY  Patient not taking: Reported on 02/22/2019   Algernon Huxley, MD Not Taking Active   spironolactone (ALDACTONE) 25 MG tablet [id number] Yes Take by mouth. Takes 12.5 mg daily [provider] Taking Active          Plan: Route note to patient's PCP. Close patient's case due to [employer] now using a different company to complete their case management services.  Patricia Bullock, PharmD, [place] Clinical Pharmacist [phone]

## 2019-05-22 ENCOUNTER — Other Ambulatory Visit: Payer: Self-pay | Admitting: Family Medicine

## 2019-05-22 DIAGNOSIS — Z1231 Encounter for screening mammogram for malignant neoplasm of breast: Secondary | ICD-10-CM

## 2019-06-27 ENCOUNTER — Ambulatory Visit
Admission: RE | Admit: 2019-06-27 | Discharge: 2019-06-27 | Disposition: A | Discharge status: Home / Self Care | Payer: PPO | Source: Ambulatory Visit | Attending: Family Medicine | Admitting: Family Medicine

## 2019-06-27 DIAGNOSIS — Z1231 Encounter for screening mammogram for malignant neoplasm of breast: Secondary | ICD-10-CM | POA: Diagnosis not present

## 2019-06-29 DIAGNOSIS — E7800 Pure hypercholesterolemia, unspecified: Secondary | ICD-10-CM | POA: Diagnosis not present

## 2019-06-29 DIAGNOSIS — I10 Essential (primary) hypertension: Secondary | ICD-10-CM | POA: Diagnosis not present

## 2019-08-17 DIAGNOSIS — I10 Essential (primary) hypertension: Secondary | ICD-10-CM | POA: Diagnosis not present

## 2019-08-17 DIAGNOSIS — E782 Mixed hyperlipidemia: Secondary | ICD-10-CM | POA: Diagnosis not present

## 2019-08-29 DIAGNOSIS — H6123 Impacted cerumen, bilateral: Secondary | ICD-10-CM | POA: Diagnosis not present

## 2019-08-29 DIAGNOSIS — J301 Allergic rhinitis due to pollen: Secondary | ICD-10-CM | POA: Diagnosis not present

## 2019-08-29 DIAGNOSIS — H6063 Unspecified chronic otitis externa, bilateral: Secondary | ICD-10-CM | POA: Diagnosis not present

## 2019-09-01 ENCOUNTER — Encounter (INDEPENDENT_AMBULATORY_CARE_PROVIDER_SITE_OTHER): Payer: PPO

## 2019-09-05 ENCOUNTER — Encounter (INDEPENDENT_AMBULATORY_CARE_PROVIDER_SITE_OTHER): Payer: Self-pay | Admitting: Vascular Surgery

## 2019-09-05 ENCOUNTER — Ambulatory Visit (INDEPENDENT_AMBULATORY_CARE_PROVIDER_SITE_OTHER): Payer: PPO | Admitting: Vascular Surgery

## 2019-09-05 ENCOUNTER — Ambulatory Visit (INDEPENDENT_AMBULATORY_CARE_PROVIDER_SITE_OTHER): Payer: PPO

## 2019-09-05 ENCOUNTER — Other Ambulatory Visit: Payer: Self-pay

## 2019-09-05 VITALS — BP 182/80 | HR 70 | Ht 65.0 in | Wt 152.0 lb

## 2019-09-05 DIAGNOSIS — I10 Essential (primary) hypertension: Secondary | ICD-10-CM | POA: Diagnosis not present

## 2019-09-05 DIAGNOSIS — I6523 Occlusion and stenosis of bilateral carotid arteries: Secondary | ICD-10-CM | POA: Diagnosis not present

## 2019-09-05 DIAGNOSIS — E785 Hyperlipidemia, unspecified: Secondary | ICD-10-CM

## 2019-09-05 NOTE — Progress Notes (Signed)
MRN : [id number]  Patricia Bullock is a 74 y.o. ([birth year]) female who presents with chief complaint of  Chief Complaint  Patient presents with  . Follow-up    48mo Carotid   .  History of Present Illness: Patient returns today in follow up of her carotid disease.  She is doing well with no current cerebrovascular symptoms. Specifically, the patient denies amaurosis fugax, speech or swallowing difficulties, or arm or leg weakness or numbness.  Side effects have limited her statin use, and she is currently taking Crestor 5 mg 3 days a week. Carotid duplex reveals stable 60 to 79% right ICA stenosis and 1 to 39% left ICA stenosis without significant progression from previous studies.  Current Outpatient Medications  Medication Sig Dispense Refill  . ALLERGY RELIEF 180 MG tablet Take 180 mg by mouth every morning.    Marland Kitchen aspirin 81 MG tablet Take 81 mg by mouth daily.      Marland Kitchen azelastine (ASTELIN) 0.1 % nasal spray Place into both nostrils 2 (two) times daily. Use in each nostril as directed    . clonazePAM (KLONOPIN) 0.5 MG tablet TAKE 1 TABLET BY MOUTH 3 TIMES DAILY AS NEEDED 90 tablet 5  . Coenzyme Q10 100 MG capsule Take 200 mg by mouth daily.     Marland Kitchen desloratadine (CLARINEX) 5 MG tablet Take 5 mg by mouth daily.    Marland Kitchen Dexlansoprazole (DEXILANT) 30 MG capsule Take 1 capsule (30 mg total) by mouth daily. 90 capsule 3  . diclofenac sodium (VOLTAREN) 1 % GEL Apply 2 g topically 4 (four) times daily. 100 g 5  . ipratropium (ATROVENT) 0.03 % nasal spray     . losartan (COZAAR) 50 MG tablet TAKE 1 TABLET BY MOUTH DAILY 90 tablet 0  . Misc Natural Products (OSTEO BI-FLEX JOINT SHIELD PO) Take by mouth.      . montelukast (SINGULAIR) 10 MG tablet Take 1 tablet (10 mg total) by mouth at bedtime. 90 tablet 3  . propranolol (INDERAL) 40 MG tablet TAKE 1 TABLET BY MOUTH TWICE DAILY 30 tablet 0  . rosuvastatin (CRESTOR) 5 MG tablet TAKE 1 TABLET DAILY 30 tablet 1  . spironolactone (ALDACTONE) 25 MG tablet  Take by mouth. Takes 12.5 mg daily    . Probiotic Product (PROBIOTIC DAILY PO) Take by mouth daily.     No current facility-administered medications for this visit.    Past Medical History:  Diagnosis Date  . Allergic rhinitis   . Anxiety   . Arthritis   . Fibroids 05/13/1990   Leiomyoma of Uterus  . GERD (gastroesophageal reflux disease)   . Headache(784.0)   . Hyperlipidemia   . Hypertension   . Panic disorder     Past Surgical History:  Procedure Laterality Date  . benign skin lesions removed    . BREAST CYST ASPIRATION Right    NEG  . COLONOSCOPY  05-23-13   per Dr. Sharlett Iles, adenomatous polyps, repeat in 5 yrs   . DILATION AND CURETTAGE OF UTERUS  1992  . POLYPECTOMY       Social History   Tobacco Use  . Smoking status: Never Smoker  . Smokeless tobacco: Never Used  Substance Use Topics  . Alcohol use: No    Alcohol/week: 0.0 standard drinks  . Drug use: No    Family History  Problem Relation Age of Onset  . Kidney cancer Mother 87       family history also  . Diabetes Mother  Type 2  . Hypertension Mother   . Diabetes Other   . Hyperlipidemia Other   . Hypertension Other   . Cancer Other   . Dementia Father   . Colon cancer Maternal Aunt   . Breast cancer Maternal Aunt   . Esophageal cancer Neg Hx   . Rectal cancer Neg Hx   . Stomach cancer Neg Hx      Allergies  Allergen Reactions  . Ace Inhibitors     REACTION: cough  . Amlodipine Besylate     REACTION: swelling  . Citalopram Hydrobromide Itching  . Clonidine Hydrochloride     REACTION: sleepiness  . Ezetimibe     REACTION: muscle aches  . Statins     REACTION: muscle aches  . Penicillins Rash     REVIEW OF SYSTEMS (Negative unless checked)  Constitutional: [] Weight loss  [] Fever  [] Chills Cardiac: [] Chest pain   [] Chest pressure   [] Palpitations   [] Shortness of breath when laying flat   [] Shortness of breath at rest   [] Shortness of breath with exertion. Vascular:   [] Pain in legs with walking   [] Pain in legs at rest   [] Pain in legs when laying flat   [] Claudication   [] Pain in feet when walking  [] Pain in feet at rest  [] Pain in feet when laying flat   [] History of DVT   [] Phlebitis   [] Swelling in legs   [] Varicose veins   [] Non-healing ulcers Pulmonary:   [] Uses home oxygen   [] Productive cough   [] Hemoptysis   [] Wheeze  [] COPD   [] Asthma Neurologic:  [] Dizziness  [] Blackouts   [] Seizures   [] History of stroke   [] History of TIA  [] Aphasia   [] Temporary blindness   [] Dysphagia   [] Weakness or numbness in arms   [] Weakness or numbness in legs Musculoskeletal:  [x] Arthritis   [] Joint swelling   [] Joint pain   [] Low back pain Hematologic:  [] Easy bruising  [] Easy bleeding   [] Hypercoagulable state   [] Anemic   Gastrointestinal:  [] Blood in stool   [] Vomiting blood  [x] Gastroesophageal reflux/heartburn   [] Abdominal pain Genitourinary:  [] Chronic kidney disease   [] Difficult urination  [] Frequent urination  [] Burning with urination   [] Hematuria Skin:  [] Rashes   [] Ulcers   [] Wounds Psychological:  [x] History of anxiety   []  History of major depression.  Physical Examination  BP (!) 182/80   Pulse 70   Ht 5\' 5"  (1.651 m)   Wt 152 lb (68.9 kg)   BMI 25.29 kg/m  Gen:  WD/WN, NAD.  Appears younger than stated age Head: [place]/AT, No temporalis wasting. Ear/Nose/Throat: Hearing grossly intact, nares w/o erythema or drainage Eyes: Conjunctiva clear. Sclera non-icteric Neck: Supple.  Trachea midline Pulmonary:  Good air movement, no use of accessory muscles.  Cardiac: RRR, no JVD Vascular:  Vessel Right Left  Radial Palpable Palpable                       Musculoskeletal: M/S 5/5 throughout.  No deformity or atrophy.  No edema. Neurologic: Sensation grossly intact in extremities.  Symmetrical.  Speech is fluent.  Psychiatric: Judgment intact, Mood & affect appropriate for pt's clinical situation. Dermatologic: No rashes or ulcers noted.  No  cellulitis or open wounds.       Labs No results found for this or any previous visit (from the past 2160 hour(s)).  Radiology No results found.  Assessment/Plan Essential hypertension blood pressure control important in reducing the progression of  atherosclerotic disease. On appropriate oral medications.   Hyperlipidemia lipid control important in reducing the progression of atherosclerotic disease.On crestor 5 mg and should continue.  Carotid stenosis Carotid duplex reveals stable 60 to 79% right ICA stenosis and 1 to 39% left ICA stenosis without significant progression from previous studies.  Patient is continuing to try to take her statin agent although side effects have become very difficult and currently she is taking it 3 or 4 days a week.  She will continue her aspirin therapy.  We will follow this on 71-month intervals as the current level of disease favors medical management over intervention for stroke risk reduction.    Leotis Pain, MD  09/05/2019 1:03 PM    This note was created with Dragon medical transcription system.  Any errors from dictation are purely unintentional

## 2019-09-05 NOTE — Assessment & Plan Note (Signed)
Carotid duplex reveals stable 60 to 79% right ICA stenosis and 1 to 39% left ICA stenosis without significant progression from previous studies.  Patient is continuing to try to take her statin agent although side effects have become very difficult and currently she is taking it 3 or 4 days a week.  She will continue her aspirin therapy.  We will follow this on 72-month intervals as the current level of disease favors medical management over intervention for stroke risk reduction.

## 2019-09-20 DIAGNOSIS — R0600 Dyspnea, unspecified: Secondary | ICD-10-CM | POA: Diagnosis not present

## 2019-09-20 DIAGNOSIS — K219 Gastro-esophageal reflux disease without esophagitis: Secondary | ICD-10-CM | POA: Diagnosis not present

## 2019-09-20 DIAGNOSIS — J329 Chronic sinusitis, unspecified: Secondary | ICD-10-CM | POA: Diagnosis not present

## 2019-09-20 DIAGNOSIS — E782 Mixed hyperlipidemia: Secondary | ICD-10-CM | POA: Diagnosis not present

## 2019-09-20 DIAGNOSIS — F411 Generalized anxiety disorder: Secondary | ICD-10-CM | POA: Diagnosis not present

## 2019-09-20 DIAGNOSIS — I739 Peripheral vascular disease, unspecified: Secondary | ICD-10-CM | POA: Diagnosis not present

## 2019-09-20 DIAGNOSIS — I10 Essential (primary) hypertension: Secondary | ICD-10-CM | POA: Diagnosis not present

## 2020-01-01 DIAGNOSIS — M1712 Unilateral primary osteoarthritis, left knee: Secondary | ICD-10-CM | POA: Diagnosis not present

## 2020-01-22 DIAGNOSIS — L821 Other seborrheic keratosis: Secondary | ICD-10-CM | POA: Diagnosis not present

## 2020-01-22 DIAGNOSIS — D2262 Melanocytic nevi of left upper limb, including shoulder: Secondary | ICD-10-CM | POA: Diagnosis not present

## 2020-01-22 DIAGNOSIS — D225 Melanocytic nevi of trunk: Secondary | ICD-10-CM | POA: Diagnosis not present

## 2020-01-22 DIAGNOSIS — D2271 Melanocytic nevi of right lower limb, including hip: Secondary | ICD-10-CM | POA: Diagnosis not present

## 2020-01-22 DIAGNOSIS — D2261 Melanocytic nevi of right upper limb, including shoulder: Secondary | ICD-10-CM | POA: Diagnosis not present

## 2020-01-22 DIAGNOSIS — D2272 Melanocytic nevi of left lower limb, including hip: Secondary | ICD-10-CM | POA: Diagnosis not present

## 2020-01-22 DIAGNOSIS — L84 Corns and callosities: Secondary | ICD-10-CM | POA: Diagnosis not present

## 2020-02-06 DIAGNOSIS — R05 Cough: Secondary | ICD-10-CM | POA: Diagnosis not present

## 2020-02-06 DIAGNOSIS — J300 Vasomotor rhinitis: Secondary | ICD-10-CM | POA: Diagnosis not present

## 2020-03-15 ENCOUNTER — Ambulatory Visit (INDEPENDENT_AMBULATORY_CARE_PROVIDER_SITE_OTHER): Payer: PPO | Admitting: Vascular Surgery

## 2020-03-15 ENCOUNTER — Ambulatory Visit (INDEPENDENT_AMBULATORY_CARE_PROVIDER_SITE_OTHER): Payer: PPO

## 2020-03-15 ENCOUNTER — Encounter (INDEPENDENT_AMBULATORY_CARE_PROVIDER_SITE_OTHER): Payer: Self-pay | Admitting: Vascular Surgery

## 2020-03-15 ENCOUNTER — Other Ambulatory Visit: Payer: Self-pay

## 2020-03-15 VITALS — BP 208/101 | HR 77 | Wt 157.0 lb

## 2020-03-15 DIAGNOSIS — I6523 Occlusion and stenosis of bilateral carotid arteries: Secondary | ICD-10-CM

## 2020-03-15 DIAGNOSIS — E785 Hyperlipidemia, unspecified: Secondary | ICD-10-CM

## 2020-03-15 DIAGNOSIS — I10 Essential (primary) hypertension: Secondary | ICD-10-CM | POA: Diagnosis not present

## 2020-03-15 NOTE — Progress Notes (Signed)
MRN : [id number]  Patricia Bullock is a 74 y.o. (Feb 26, 1946) female who presents with chief complaint of  Chief Complaint  Patient presents with  . Follow-up    U/S follow up  .  History of Present Illness: Patient returns in follow-up of her carotid disease.  She is doing well today without specific complaints.  Her blood pressure today is much higher than it typically is at home.  She says she has been running on the low side with her home checks.  No new focal neurologic symptoms. Specifically, the patient denies amaurosis fugax, speech or swallowing difficulties, or arm or leg weakness or numbness.  Carotid duplex today shows stable 60 to 79% right ICA stenosis without significant progression from previous studies.  Her left carotid stenosis falls just into the 40 to 59% range which is slightly more than it was at her last visit.  Current Outpatient Medications  Medication Sig Dispense Refill  . azelastine (ASTELIN) 0.1 % nasal spray Place into both nostrils 2 (two) times daily. Use in each nostril as directed    . Coenzyme Q10 100 MG capsule Take 200 mg by mouth daily.     Marland Kitchen Dexlansoprazole (DEXILANT) 30 MG capsule Take 1 capsule (30 mg total) by mouth daily. 90 capsule 3  . diclofenac sodium (VOLTAREN) 1 % GEL Apply 2 g topically 4 (four) times daily. 100 g 5  . ipratropium (ATROVENT) 0.03 % nasal spray     . Misc Natural Products (OSTEO BI-FLEX JOINT SHIELD PO) Take by mouth.      . montelukast (SINGULAIR) 10 MG tablet Take 1 tablet (10 mg total) by mouth at bedtime. 90 tablet 3  . propranolol (INDERAL) 40 MG tablet TAKE 1 TABLET BY MOUTH TWICE DAILY 30 tablet 0  . rosuvastatin (CRESTOR) 5 MG tablet TAKE 1 TABLET DAILY 30 tablet 1  . ALLERGY RELIEF 180 MG tablet Take 180 mg by mouth every morning.    Marland Kitchen aspirin 81 MG tablet Take 81 mg by mouth daily.   (Patient not taking: Reported on 03/15/2020)    . clonazePAM (KLONOPIN) 0.5 MG tablet TAKE 1 TABLET BY MOUTH 3 TIMES DAILY AS NEEDED  (Patient not taking: Reported on 03/15/2020) 90 tablet 5  . desloratadine (CLARINEX) 5 MG tablet Take 5 mg by mouth daily.    Marland Kitchen losartan (COZAAR) 50 MG tablet TAKE 1 TABLET BY MOUTH DAILY (Patient not taking: Reported on 03/15/2020) 90 tablet 0  . Probiotic Product (PROBIOTIC DAILY PO) Take by mouth daily. (Patient not taking: Reported on 03/15/2020)    . spironolactone (ALDACTONE) 25 MG tablet Take by mouth. Takes 12.5 mg daily (Patient not taking: Reported on 03/15/2020)     No current facility-administered medications for this visit.    Past Medical History:  Diagnosis Date  . Allergic rhinitis   . Anxiety   . Arthritis   . Fibroids 05/13/1990   Leiomyoma of Uterus  . GERD (gastroesophageal reflux disease)   . Headache(784.0)   . Hyperlipidemia   . Hypertension   . Panic disorder     Past Surgical History:  Procedure Laterality Date  . benign skin lesions removed    . BREAST CYST ASPIRATION Right    NEG  . COLONOSCOPY  05-23-13   per Dr. Sharlett Iles, adenomatous polyps, repeat in 5 yrs   . DILATION AND CURETTAGE OF UTERUS  1992  . POLYPECTOMY       Social History   Tobacco Use  . Smoking  status: Never Smoker  . Smokeless tobacco: Never Used  Substance Use Topics  . Alcohol use: No    Alcohol/week: 0.0 standard drinks  . Drug use: No      Family History  Problem Relation Age of Onset  . Kidney cancer Mother 59       family history also  . Diabetes Mother        Type 2  . Hypertension Mother   . Diabetes Other   . Hyperlipidemia Other   . Hypertension Other   . Cancer Other   . Dementia Father   . Colon cancer Maternal Aunt   . Breast cancer Maternal Aunt   . Esophageal cancer Neg Hx   . Rectal cancer Neg Hx   . Stomach cancer Neg Hx     Allergies  Allergen Reactions  . Ace Inhibitors     REACTION: cough  . Amlodipine Besylate     REACTION: swelling  . Citalopram Hydrobromide Itching  . Clonidine Hydrochloride     REACTION: sleepiness  . Ezetimibe      REACTION: muscle aches  . Statins     REACTION: muscle aches  . Penicillins Rash    REVIEW OF SYSTEMS (Negative unless checked)  Constitutional: [] ?Weight loss  [] ?Fever  [] ?Chills Cardiac: [] ?Chest pain   [] ?Chest pressure   [] ?Palpitations   [] ?Shortness of breath when laying flat   [] ?Shortness of breath at rest   [] ?Shortness of breath with exertion. Vascular:  [] ?Pain in legs with walking   [] ?Pain in legs at rest   [] ?Pain in legs when laying flat   [] ?Claudication   [] ?Pain in feet when walking  [] ?Pain in feet at rest  [] ?Pain in feet when laying flat   [] ?History of DVT   [] ?Phlebitis   [] ?Swelling in legs   [] ?Varicose veins   [] ?Non-healing ulcers Pulmonary:   [] ?Uses home oxygen   [] ?Productive cough   [] ?Hemoptysis   [] ?Wheeze  [] ?COPD   [] ?Asthma Neurologic:  [] ?Dizziness  [] ?Blackouts   [] ?Seizures   [] ?History of stroke   [] ?History of TIA  [] ?Aphasia   [] ?Temporary blindness   [] ?Dysphagia   [] ?Weakness or numbness in arms   [] ?Weakness or numbness in legs Musculoskeletal:  [x] ?Arthritis   [] ?Joint swelling   [] ?Joint pain   [] ?Low back pain Hematologic:  [] ?Easy bruising  [] ?Easy bleeding   [] ?Hypercoagulable state   [] ?Anemic   Gastrointestinal:  [] ?Blood in stool   [] ?Vomiting blood  [x] ?Gastroesophageal reflux/heartburn   [] ?Abdominal pain Genitourinary:  [] ?Chronic kidney disease   [] ?Difficult urination  [] ?Frequent urination  [] ?Burning with urination   [] ?Hematuria Skin:  [] ?Rashes   [] ?Ulcers   [] ?Wounds Psychological:  [x] ?History of anxiety   [] ? History of major depression.  Physical Examination  Vitals:   03/15/20 1036  BP: (!) 208/101  Pulse: 77  Weight: 157 lb (71.2 kg)   Body mass index is 26.13 kg/m. Gen:  WD/WN, NAD. Appears younger than stated age. Head: [place]/AT, No temporalis wasting. Ear/Nose/Throat: Hearing grossly intact, nares w/o erythema or drainage, trachea midline Eyes: Conjunctiva clear. Sclera non-icteric Neck: Supple.  Right  carotid bruit  Pulmonary:  Good air movement, equal and clear to auscultation bilaterally.  Cardiac: RRR, No JVD Vascular:  Vessel Right Left  Radial Palpable Palpable   Musculoskeletal: M/S 5/5 throughout.  No deformity or atrophy. No edema. Neurologic: CN 2-12 intact. Sensation grossly intact in extremities.  Symmetrical.  Speech is fluent. Motor exam as listed above. Psychiatric: Judgment intact, Mood & affect  appropriate for pt's clinical situation. Dermatologic: No rashes or ulcers noted.  No cellulitis or open wounds.      CBC Lab Results  Component Value Date   WBC 3.0 (L) 06/08/2017   HGB 13.2 06/08/2017   HCT 40.6 06/08/2017   MCV 90.2 06/08/2017   PLT 210.0 06/08/2017    BMET    Component Value Date/Time   NA 143 06/08/2017 0856   NA 144 10/09/2011 0015   K 4.1 06/08/2017 0856   K 3.7 10/09/2011 0015   CL 105 06/08/2017 0856   CL 108 (H) 10/09/2011 0015   CO2 31 06/08/2017 0856   CO2 26 10/09/2011 0015   GLUCOSE 100 (H) 06/08/2017 0856   GLUCOSE 123 (H) 10/09/2011 0015   BUN 14 06/08/2017 0856   BUN 13 10/09/2011 0015   CREATININE 0.74 06/08/2017 0856   CREATININE 0.67 10/09/2011 0015   CALCIUM 9.2 06/08/2017 0856   CALCIUM 9.4 10/09/2011 0015   GFRNONAA >60 10/09/2011 0015   GFRAA >60 10/09/2011 0015   CrCl cannot be calculated (Patient's most recent lab result is older than the maximum 21 days allowed.).  COAG No results found for: INR, PROTIME  Radiology No results found.   Assessment/Plan Essential hypertension blood pressure control important in reducing the progression of atherosclerotic disease. On appropriate oral medications.   Hyperlipidemia lipid control important in reducing the progression of atherosclerotic disease.On crestor 5 mg and should continue.  Carotid stenosis Carotid duplex today shows stable 60 to 79% right ICA stenosis without significant progression from previous studies.  Her left carotid stenosis falls just  into the 40 to 59% range which is slightly more than it was at her last visit.  No significant change.  Patient overall doing well without symptoms.  Continue to follow this on 60-month intervals.    Leotis Pain, MD  03/15/2020 11:16 AM    This note was created with Dragon medical transcription system.  Any errors from dictation are purely unintentional

## 2020-03-15 NOTE — Assessment & Plan Note (Signed)
Carotid duplex today shows stable 60 to 79% right ICA stenosis without significant progression from previous studies.  Her left carotid stenosis falls just into the 40 to 59% range which is slightly more than it was at her last visit.  No significant change.  Patient overall doing well without symptoms.  Continue to follow this on 23-month intervals.

## 2020-05-23 ENCOUNTER — Other Ambulatory Visit: Payer: Self-pay | Admitting: Family Medicine

## 2020-05-23 DIAGNOSIS — Z1231 Encounter for screening mammogram for malignant neoplasm of breast: Secondary | ICD-10-CM

## 2020-06-28 ENCOUNTER — Other Ambulatory Visit: Payer: Self-pay

## 2020-06-28 ENCOUNTER — Ambulatory Visit
Admission: RE | Admit: 2020-06-28 | Discharge: 2020-06-28 | Disposition: A | Discharge status: Home / Self Care | Payer: PPO | Source: Ambulatory Visit | Attending: Family Medicine | Admitting: Family Medicine

## 2020-06-28 DIAGNOSIS — Z1231 Encounter for screening mammogram for malignant neoplasm of breast: Secondary | ICD-10-CM | POA: Insufficient documentation

## 2020-07-09 DIAGNOSIS — M5451 Vertebrogenic low back pain: Secondary | ICD-10-CM | POA: Diagnosis not present

## 2020-07-09 DIAGNOSIS — M461 Sacroiliitis, not elsewhere classified: Secondary | ICD-10-CM | POA: Diagnosis not present

## 2020-07-09 DIAGNOSIS — M9903 Segmental and somatic dysfunction of lumbar region: Secondary | ICD-10-CM | POA: Diagnosis not present

## 2020-07-09 DIAGNOSIS — M9904 Segmental and somatic dysfunction of sacral region: Secondary | ICD-10-CM | POA: Diagnosis not present

## 2020-07-11 DIAGNOSIS — M9904 Segmental and somatic dysfunction of sacral region: Secondary | ICD-10-CM | POA: Diagnosis not present

## 2020-07-11 DIAGNOSIS — M9903 Segmental and somatic dysfunction of lumbar region: Secondary | ICD-10-CM | POA: Diagnosis not present

## 2020-07-11 DIAGNOSIS — M461 Sacroiliitis, not elsewhere classified: Secondary | ICD-10-CM | POA: Diagnosis not present

## 2020-07-11 DIAGNOSIS — M5451 Vertebrogenic low back pain: Secondary | ICD-10-CM | POA: Diagnosis not present

## 2020-08-28 DIAGNOSIS — M1712 Unilateral primary osteoarthritis, left knee: Secondary | ICD-10-CM | POA: Diagnosis not present

## 2020-09-09 ENCOUNTER — Other Ambulatory Visit (INDEPENDENT_AMBULATORY_CARE_PROVIDER_SITE_OTHER): Payer: Self-pay | Admitting: Vascular Surgery

## 2020-09-09 DIAGNOSIS — I6521 Occlusion and stenosis of right carotid artery: Secondary | ICD-10-CM

## 2020-09-10 ENCOUNTER — Ambulatory Visit (INDEPENDENT_AMBULATORY_CARE_PROVIDER_SITE_OTHER): Payer: PPO

## 2020-09-10 ENCOUNTER — Encounter (INDEPENDENT_AMBULATORY_CARE_PROVIDER_SITE_OTHER): Payer: PPO

## 2020-09-10 ENCOUNTER — Other Ambulatory Visit: Payer: Self-pay

## 2020-09-10 ENCOUNTER — Encounter (INDEPENDENT_AMBULATORY_CARE_PROVIDER_SITE_OTHER): Payer: Self-pay | Admitting: Vascular Surgery

## 2020-09-10 ENCOUNTER — Ambulatory Visit (INDEPENDENT_AMBULATORY_CARE_PROVIDER_SITE_OTHER): Payer: PPO | Admitting: Vascular Surgery

## 2020-09-10 VITALS — BP 206/103 | HR 67 | Ht 65.0 in | Wt 156.0 lb

## 2020-09-10 DIAGNOSIS — E785 Hyperlipidemia, unspecified: Secondary | ICD-10-CM | POA: Diagnosis not present

## 2020-09-10 DIAGNOSIS — I10 Essential (primary) hypertension: Secondary | ICD-10-CM | POA: Diagnosis not present

## 2020-09-10 DIAGNOSIS — I6521 Occlusion and stenosis of right carotid artery: Secondary | ICD-10-CM

## 2020-09-10 DIAGNOSIS — I6523 Occlusion and stenosis of bilateral carotid arteries: Secondary | ICD-10-CM | POA: Diagnosis not present

## 2020-09-10 NOTE — Progress Notes (Signed)
MRN : [id number]  Patricia Bullock is a 75 y.o. (07/06/1945) female who presents with chief complaint of  Chief Complaint  Patient presents with  . Follow-up  . Carotid    6 Mo   .  History of Present Illness: Patient returns today in follow up of her carotid disease.  She has had no focal neurologic symptoms since her last visit.  She has been taking her statin medication 5 days a week but it is still extremely limiting in terms of myalgias and leg pain.  She is very active and it really is hampering her lifestyle.  Her carotid duplex today looks significantly better than it did last time with right ICA velocities in the upper end of the 40 to 59% range and left ICA velocities in the upper end of the 1 to 39% range.  Previously, these were in the 60 to 79% range on the right in the 40 to 59% range on the left.  Current Outpatient Medications  Medication Sig Dispense Refill  . aspirin 81 MG tablet Take 81 mg by mouth daily.    Marland Kitchen azelastine (ASTELIN) 0.1 % nasal spray Place into both nostrils 2 (two) times daily. Use in each nostril as directed    . clonazePAM (KLONOPIN) 0.5 MG tablet TAKE 1 TABLET BY MOUTH 3 TIMES DAILY AS NEEDED 90 tablet 5  . Coenzyme Q10 100 MG capsule Take 200 mg by mouth daily.     Marland Kitchen Dexlansoprazole (DEXILANT) 30 MG capsule Take 1 capsule (30 mg total) by mouth daily. 90 capsule 3  . diclofenac sodium (VOLTAREN) 1 % GEL Apply 2 g topically 4 (four) times daily. 100 g 5  . ipratropium (ATROVENT) 0.03 % nasal spray     . losartan (COZAAR) 50 MG tablet TAKE 1 TABLET BY MOUTH DAILY 90 tablet 0  . Misc Natural Products (OSTEO BI-FLEX JOINT SHIELD PO) Take by mouth.    . montelukast (SINGULAIR) 10 MG tablet Take 1 tablet (10 mg total) by mouth at bedtime. 90 tablet 3  . Probiotic Product (PROBIOTIC DAILY PO) Take by mouth daily.    . propranolol (INDERAL) 40 MG tablet TAKE 1 TABLET BY MOUTH TWICE DAILY 30 tablet 0  . rosuvastatin (CRESTOR) 5 MG tablet TAKE 1 TABLET DAILY 30  tablet 1  . spironolactone (ALDACTONE) 25 MG tablet Take by mouth. Takes 12.5 mg daily    . ALLERGY RELIEF 180 MG tablet Take 180 mg by mouth every morning.    . desloratadine (CLARINEX) 5 MG tablet Take 5 mg by mouth daily.     No current facility-administered medications for this visit.    Past Medical History:  Diagnosis Date  . Allergic rhinitis   . Anxiety   . Arthritis   . Fibroids 05/13/1990   Leiomyoma of Uterus  . GERD (gastroesophageal reflux disease)   . Headache(784.0)   . Hyperlipidemia   . Hypertension   . Panic disorder     Past Surgical History:  Procedure Laterality Date  . benign skin lesions removed    . BREAST CYST ASPIRATION Right    NEG  . COLONOSCOPY  05-23-13   per Dr. Sharlett Iles, adenomatous polyps, repeat in 5 yrs   . DILATION AND CURETTAGE OF UTERUS  1992  . POLYPECTOMY       Social History   Tobacco Use  . Smoking status: Never Smoker  . Smokeless tobacco: Never Used  Substance Use Topics  . Alcohol use: No  Alcohol/week: 0.0 standard drinks  . Drug use: No     Family History  Problem Relation Age of Onset  . Kidney cancer Mother 25       family history also  . Diabetes Mother        Type 2  . Hypertension Mother   . Diabetes Other   . Hyperlipidemia Other   . Hypertension Other   . Cancer Other   . Dementia Father   . Colon cancer Maternal Aunt   . Breast cancer Maternal Aunt   . Esophageal cancer Neg Hx   . Rectal cancer Neg Hx   . Stomach cancer Neg Hx     Allergies  Allergen Reactions  . Ace Inhibitors     REACTION: cough  . Amlodipine Besylate     REACTION: swelling  . Citalopram Hydrobromide Itching  . Clonidine Hydrochloride     REACTION: sleepiness  . Ezetimibe     REACTION: muscle aches  . Statins     REACTION: muscle aches  . Penicillins Rash     REVIEW OF SYSTEMS (Negative unless checked)  Constitutional: [] Weight loss  [] Fever  [] Chills Cardiac: [] Chest pain   [] Chest pressure   [] Palpitations    [] Shortness of breath when laying flat   [] Shortness of breath at rest   [] Shortness of breath with exertion. Vascular:  [] Pain in legs with walking   [] Pain in legs at rest   [] Pain in legs when laying flat   [] Claudication   [] Pain in feet when walking  [] Pain in feet at rest  [] Pain in feet when laying flat   [] History of DVT   [] Phlebitis   [] Swelling in legs   [] Varicose veins   [] Non-healing ulcers Pulmonary:   [] Uses home oxygen   [] Productive cough   [] Hemoptysis   [] Wheeze  [] COPD   [] Asthma Neurologic:  [] Dizziness  [] Blackouts   [] Seizures   [] History of stroke   [] History of TIA  [] Aphasia   [] Temporary blindness   [] Dysphagia   [] Weakness or numbness in arms   [x] Weakness or numbness in legs Musculoskeletal:  [] Arthritis   [] Joint swelling   [x] Joint pain   [] Low back pain Hematologic:  [] Easy bruising  [] Easy bleeding   [] Hypercoagulable state   [] Anemic   Gastrointestinal:  [] Blood in stool   [] Vomiting blood  [] Gastroesophageal reflux/heartburn   [] Abdominal pain Genitourinary:  [] Chronic kidney disease   [] Difficult urination  [] Frequent urination  [] Burning with urination   [] Hematuria Skin:  [] Rashes   [] Ulcers   [] Wounds Psychological:  [] History of anxiety   []  History of major depression.  Physical Examination  BP (!) 206/103   Pulse 67   Ht 5\' 5"  (1.651 m)   Wt 156 lb (70.8 kg)   BMI 25.96 kg/m  Gen:  WD/WN, NAD Head: [place]/AT, No temporalis wasting. Ear/Nose/Throat: Hearing grossly intact, nares w/o erythema or drainage Eyes: Conjunctiva clear. Sclera non-icteric Neck: Supple.  Trachea midline Pulmonary:  Good air movement, no use of accessory muscles.  Cardiac: RRR, no JVD Vascular:  Vessel Right Left  Radial Palpable Palpable            Musculoskeletal: M/S 5/5 throughout.  No deformity or atrophy. No edema. Neurologic: Sensation grossly intact in extremities.  Symmetrical.  Speech is fluent.  Psychiatric: Judgment intact, Mood & affect appropriate for pt's  clinical situation. Dermatologic: No rashes or ulcers noted.  No cellulitis or open wounds.       Labs No results found for  this or any previous visit (from the past 2160 hour(s)).  Radiology No results found.  Assessment/Plan Essential hypertension blood pressure control important in reducing the progression of atherosclerotic disease. On appropriate oral medications.   Hyperlipidemia lipid control important in reducing the progression of atherosclerotic disease.On crestor 5 mg five days a week and should continue this if she can.  It does seem to be helping her carotid disease.  I have told her if her symptoms are too disabling to continue, I understand and this will be a decision she will have to make.  Carotid stenosis Her carotid duplex today looks significantly better than it did last time with right ICA velocities in the upper end of the 40 to 59% range and left ICA velocities in the upper end of the 1 to 39% range.  Previously, these were in the 60 to 79% range on the right in the 40 to 59% range on the left.  This would indicate that statin is helping, but symptoms really are disabling to her so I am not sure she is going to be able to continue this.  That will be a decision she has to make.  Continue her antiplatelet therapy.  Continue follow-up interval of 6 months at this point.    Leotis Pain, MD  09/10/2020 9:07 AM    This note was created with Dragon medical transcription system.  Any errors from dictation are purely unintentional

## 2020-09-10 NOTE — Assessment & Plan Note (Signed)
Her carotid duplex today looks significantly better than it did last time with right ICA velocities in the upper end of the 40 to 59% range and left ICA velocities in the upper end of the 1 to 39% range.  Previously, these were in the 60 to 79% range on the right in the 40 to 59% range on the left.  This would indicate that statin is helping, but symptoms really are disabling to her so I am not sure she is going to be able to continue this.  That will be a decision she has to make.  Continue her antiplatelet therapy.  Continue follow-up interval of 6 months at this point.

## 2020-10-08 DIAGNOSIS — R0600 Dyspnea, unspecified: Secondary | ICD-10-CM | POA: Diagnosis not present

## 2020-10-08 DIAGNOSIS — J4520 Mild intermittent asthma, uncomplicated: Secondary | ICD-10-CM | POA: Diagnosis not present

## 2020-10-08 DIAGNOSIS — Z79899 Other long term (current) drug therapy: Secondary | ICD-10-CM | POA: Diagnosis not present

## 2020-10-08 DIAGNOSIS — F411 Generalized anxiety disorder: Secondary | ICD-10-CM | POA: Diagnosis not present

## 2020-10-08 DIAGNOSIS — J329 Chronic sinusitis, unspecified: Secondary | ICD-10-CM | POA: Diagnosis not present

## 2020-10-08 DIAGNOSIS — I10 Essential (primary) hypertension: Secondary | ICD-10-CM | POA: Diagnosis not present

## 2020-10-08 DIAGNOSIS — I6523 Occlusion and stenosis of bilateral carotid arteries: Secondary | ICD-10-CM | POA: Diagnosis not present

## 2020-10-08 DIAGNOSIS — K219 Gastro-esophageal reflux disease without esophagitis: Secondary | ICD-10-CM | POA: Diagnosis not present

## 2020-10-08 DIAGNOSIS — I739 Peripheral vascular disease, unspecified: Secondary | ICD-10-CM | POA: Diagnosis not present

## 2020-10-08 DIAGNOSIS — E785 Hyperlipidemia, unspecified: Secondary | ICD-10-CM | POA: Diagnosis not present

## 2021-01-08 DIAGNOSIS — D2339 Other benign neoplasm of skin of other parts of face: Secondary | ICD-10-CM | POA: Diagnosis not present

## 2021-01-08 DIAGNOSIS — L570 Actinic keratosis: Secondary | ICD-10-CM | POA: Diagnosis not present

## 2021-01-08 DIAGNOSIS — L821 Other seborrheic keratosis: Secondary | ICD-10-CM | POA: Diagnosis not present

## 2021-01-08 DIAGNOSIS — X32XXXA Exposure to sunlight, initial encounter: Secondary | ICD-10-CM | POA: Diagnosis not present

## 2021-02-20 DIAGNOSIS — J301 Allergic rhinitis due to pollen: Secondary | ICD-10-CM | POA: Diagnosis not present

## 2021-02-20 DIAGNOSIS — H6123 Impacted cerumen, bilateral: Secondary | ICD-10-CM | POA: Diagnosis not present

## 2021-03-11 ENCOUNTER — Encounter (INDEPENDENT_AMBULATORY_CARE_PROVIDER_SITE_OTHER): Payer: Self-pay | Admitting: Vascular Surgery

## 2021-03-11 ENCOUNTER — Ambulatory Visit (INDEPENDENT_AMBULATORY_CARE_PROVIDER_SITE_OTHER): Payer: PPO | Admitting: Vascular Surgery

## 2021-03-11 ENCOUNTER — Other Ambulatory Visit: Payer: Self-pay

## 2021-03-11 ENCOUNTER — Ambulatory Visit (INDEPENDENT_AMBULATORY_CARE_PROVIDER_SITE_OTHER): Payer: PPO

## 2021-03-11 VITALS — BP 194/113 | HR 69 | Resp 16 | Wt 152.6 lb

## 2021-03-11 DIAGNOSIS — E785 Hyperlipidemia, unspecified: Secondary | ICD-10-CM | POA: Diagnosis not present

## 2021-03-11 DIAGNOSIS — I6523 Occlusion and stenosis of bilateral carotid arteries: Secondary | ICD-10-CM

## 2021-03-11 DIAGNOSIS — I10 Essential (primary) hypertension: Secondary | ICD-10-CM

## 2021-03-11 NOTE — Progress Notes (Signed)
MRN : [id number]  Patricia Bullock is a 75 y.o. (1945/08/22) female who presents with chief complaint of  Chief Complaint  Patient presents with   Follow-up    Ultrasound follow up  .  History of Present Illness: Patient returns in follow-up of her carotid disease.  She is still bothered by symptoms from her statin, but is trying to do her best to take this.  She did stop for about a month but has resumed this.  No focal neurologic symptoms. Specifically, the patient denies amaurosis fugax, speech or swallowing difficulties, or arm or leg weakness or numbness. Her carotid duplex today looks better than it did last time with right ICA velocities in the midportion of the 40 to 59% range and left ICA velocities in the upper end of the 1 to 39% range.   Current Outpatient Medications  Medication Sig Dispense Refill   aspirin 81 MG tablet Take 81 mg by mouth daily.     azelastine (ASTELIN) 0.1 % nasal spray Place into both nostrils 2 (two) times daily. Use in each nostril as directed     clonazePAM (KLONOPIN) 0.5 MG tablet TAKE 1 TABLET BY MOUTH 3 TIMES DAILY AS NEEDED 90 tablet 5   Coenzyme Q10 100 MG capsule Take 200 mg by mouth daily.      Dexlansoprazole (DEXILANT) 30 MG capsule Take 1 capsule (30 mg total) by mouth daily. 90 capsule 3   ipratropium (ATROVENT) 0.03 % nasal spray      losartan (COZAAR) 50 MG tablet TAKE 1 TABLET BY MOUTH DAILY 90 tablet 0   meloxicam (MOBIC) 7.5 MG tablet Take 7.5 mg by mouth daily.     Misc Natural Products (OSTEO BI-FLEX JOINT SHIELD PO) Take by mouth.     montelukast (SINGULAIR) 10 MG tablet Take 1 tablet (10 mg total) by mouth at bedtime. 90 tablet 3   propranolol (INDERAL) 40 MG tablet TAKE 1 TABLET BY MOUTH TWICE DAILY 30 tablet 0   rosuvastatin (CRESTOR) 5 MG tablet TAKE 1 TABLET DAILY 30 tablet 1   spironolactone (ALDACTONE) 25 MG tablet Take by mouth. Takes 12.5 mg daily     ALLERGY RELIEF 180 MG tablet Take 180 mg by mouth every morning.      desloratadine (CLARINEX) 5 MG tablet Take 5 mg by mouth daily.     diclofenac sodium (VOLTAREN) 1 % GEL Apply 2 g topically 4 (four) times daily. 100 g 5   Probiotic Product (PROBIOTIC DAILY PO) Take by mouth daily.     No current facility-administered medications for this visit.    Past Medical History:  Diagnosis Date   Allergic rhinitis    Anxiety    Arthritis    Fibroids 05/13/1990   Leiomyoma of Uterus   GERD (gastroesophageal reflux disease)    Headache(784.0)    Hyperlipidemia    Hypertension    Panic disorder     Past Surgical History:  Procedure Laterality Date   benign skin lesions removed     BREAST CYST ASPIRATION Right    NEG   COLONOSCOPY  05-23-13   per Dr. Sharlett Iles, adenomatous polyps, repeat in 5 yrs    DILATION AND CURETTAGE OF UTERUS  1992   POLYPECTOMY       Social History   Tobacco Use   Smoking status: Never   Smokeless tobacco: Never  Substance Use Topics   Alcohol use: No    Alcohol/week: 0.0 standard drinks   Drug use: No  Family History  Problem Relation Age of Onset   Kidney cancer Mother 65       family history also   Diabetes Mother        Type 2   Hypertension Mother    Diabetes Other    Hyperlipidemia Other    Hypertension Other    Cancer Other    Dementia Father    Colon cancer Maternal Aunt    Breast cancer Maternal Aunt    Esophageal cancer Neg Hx    Rectal cancer Neg Hx    Stomach cancer Neg Hx      Allergies  Allergen Reactions   Ace Inhibitors     REACTION: cough   Amlodipine Besylate     REACTION: swelling   Citalopram Hydrobromide Itching   Clonidine Hydrochloride     REACTION: sleepiness   Ezetimibe     REACTION: muscle aches   Statins     REACTION: muscle aches   Penicillins Rash     REVIEW OF SYSTEMS (Negative unless checked)  Constitutional: [] Weight loss  [] Fever  [] Chills Cardiac: [] Chest pain   [] Chest pressure   [] Palpitations   [] Shortness of breath when laying flat   [] Shortness  of breath at rest   [] Shortness of breath with exertion. Vascular:  [] Pain in legs with walking   [] Pain in legs at rest   [] Pain in legs when laying flat   [] Claudication   [] Pain in feet when walking  [] Pain in feet at rest  [] Pain in feet when laying flat   [] History of DVT   [] Phlebitis   [] Swelling in legs   [] Varicose veins   [] Non-healing ulcers Pulmonary:   [] Uses home oxygen   [] Productive cough   [] Hemoptysis   [] Wheeze  [] COPD   [] Asthma Neurologic:  [] Dizziness  [] Blackouts   [] Seizures   [] History of stroke   [] History of TIA  [] Aphasia   [] Temporary blindness   [] Dysphagia   [] Weakness or numbness in arms   [x] Weakness or numbness in legs Musculoskeletal:  [x] Arthritis   [] Joint swelling   [x] Joint pain   [] Low back pain Hematologic:  [] Easy bruising  [] Easy bleeding   [] Hypercoagulable state   [] Anemic  [] Hepatitis Gastrointestinal:  [] Blood in stool   [] Vomiting blood  [] Gastroesophageal reflux/heartburn   [] Difficulty swallowing. Genitourinary:  [] Chronic kidney disease   [] Difficult urination  [] Frequent urination  [] Burning with urination   [] Blood in urine Skin:  [] Rashes   [] Ulcers   [] Wounds Psychological:  [] History of anxiety   []  History of major depression.  Physical Examination  Vitals:   03/11/21 0913  BP: (!) 194/113  Pulse: 69  Resp: 16  Weight: 152 lb 9.6 oz (69.2 kg)   Body mass index is 25.39 kg/m. Gen:  WD/WN, NAD Head: [place]/AT, No temporalis wasting. Ear/Nose/Throat: Hearing grossly intact, nares w/o erythema or drainage, trachea midline Eyes: Conjunctiva clear. Sclera non-icteric Neck: Supple.  Right carotid bruit  Pulmonary:  Good air movement, equal and clear to auscultation bilaterally.  Cardiac: RRR, No JVD Vascular:  Vessel Right Left  Radial Palpable Palpable       Musculoskeletal: M/S 5/5 throughout.  No deformity or atrophy.  No edema. Neurologic: CN 2-12 intact. Sensation grossly intact in extremities.  Symmetrical.  Speech is fluent.  Motor exam as listed above. Psychiatric: Judgment intact, Mood & affect appropriate for pt's clinical situation. Dermatologic: No rashes or ulcers noted.  No cellulitis or open wounds. Lymph : No Cervical, Axillary, or Inguinal lymphadenopathy.  CBC Lab Results  Component Value Date   WBC 3.0 (L) 06/08/2017   HGB 13.2 06/08/2017   HCT 40.6 06/08/2017   MCV 90.2 06/08/2017   PLT 210.0 06/08/2017    BMET    Component Value Date/Time   NA 143 06/08/2017 0856   NA 144 10/09/2011 0015   K 4.1 06/08/2017 0856   K 3.7 10/09/2011 0015   CL 105 06/08/2017 0856   CL 108 (H) 10/09/2011 0015   CO2 31 06/08/2017 0856   CO2 26 10/09/2011 0015   GLUCOSE 100 (H) 06/08/2017 0856   GLUCOSE 123 (H) 10/09/2011 0015   BUN 14 06/08/2017 0856   BUN 13 10/09/2011 0015   CREATININE 0.74 06/08/2017 0856   CREATININE 0.67 10/09/2011 0015   CALCIUM 9.2 06/08/2017 0856   CALCIUM 9.4 10/09/2011 0015   GFRNONAA >60 10/09/2011 0015   GFRAA >60 10/09/2011 0015   CrCl cannot be calculated (Patient's most recent lab result is older than the maximum 21 days allowed.).  COAG No results found for: INR, PROTIME  Radiology No results found.   Assessment/Plan Essential hypertension blood pressure control important in reducing the progression of atherosclerotic disease. On appropriate oral medications.     Hyperlipidemia lipid control important in reducing the progression of atherosclerotic disease.  On crestor 5 mg five days a week and should continue this if she can.  It does seem to be helping her carotid disease.  I have told her if her symptoms are too disabling to continue, I understand and this will be a decision she will have to make.   Carotid stenosis Her carotid duplex today looks better than it did last time with right ICA velocities in the midportion of the 40 to 59% range and left ICA velocities in the upper end of the 1 to 39% range.   This would indicate that statin is helping, but  symptoms really are bothersome to her but she is trying to tough it out.  We will continue to follow her on 65-month intervals.    Leotis Pain, MD  03/11/2021 9:44 AM    This note was created with Dragon medical transcription system.  Any errors from dictation are purely unintentional

## 2021-04-10 DIAGNOSIS — I10 Essential (primary) hypertension: Secondary | ICD-10-CM | POA: Diagnosis not present

## 2021-04-10 DIAGNOSIS — E7800 Pure hypercholesterolemia, unspecified: Secondary | ICD-10-CM | POA: Diagnosis not present

## 2021-04-10 DIAGNOSIS — I6523 Occlusion and stenosis of bilateral carotid arteries: Secondary | ICD-10-CM | POA: Diagnosis not present

## 2021-04-22 DIAGNOSIS — E7800 Pure hypercholesterolemia, unspecified: Secondary | ICD-10-CM | POA: Diagnosis not present

## 2021-04-22 DIAGNOSIS — I10 Essential (primary) hypertension: Secondary | ICD-10-CM | POA: Diagnosis not present

## 2021-05-27 ENCOUNTER — Other Ambulatory Visit: Payer: Self-pay | Admitting: Family Medicine

## 2021-05-27 DIAGNOSIS — Z1231 Encounter for screening mammogram for malignant neoplasm of breast: Secondary | ICD-10-CM

## 2021-06-02 DIAGNOSIS — M170 Bilateral primary osteoarthritis of knee: Secondary | ICD-10-CM | POA: Diagnosis not present

## 2021-06-30 ENCOUNTER — Other Ambulatory Visit: Payer: Self-pay

## 2021-06-30 ENCOUNTER — Ambulatory Visit
Admission: RE | Admit: 2021-06-30 | Discharge: 2021-06-30 | Disposition: A | Discharge status: Home / Self Care | Payer: PPO | Source: Ambulatory Visit | Attending: Family Medicine | Admitting: Family Medicine

## 2021-06-30 DIAGNOSIS — Z1231 Encounter for screening mammogram for malignant neoplasm of breast: Secondary | ICD-10-CM | POA: Insufficient documentation

## 2021-08-11 DIAGNOSIS — F411 Generalized anxiety disorder: Secondary | ICD-10-CM | POA: Diagnosis not present

## 2021-08-11 DIAGNOSIS — I10 Essential (primary) hypertension: Secondary | ICD-10-CM | POA: Diagnosis not present

## 2021-08-11 DIAGNOSIS — E785 Hyperlipidemia, unspecified: Secondary | ICD-10-CM | POA: Diagnosis not present

## 2021-08-11 DIAGNOSIS — J4520 Mild intermittent asthma, uncomplicated: Secondary | ICD-10-CM | POA: Diagnosis not present

## 2021-08-11 DIAGNOSIS — R0609 Other forms of dyspnea: Secondary | ICD-10-CM | POA: Diagnosis not present

## 2021-08-11 DIAGNOSIS — J329 Chronic sinusitis, unspecified: Secondary | ICD-10-CM | POA: Diagnosis not present

## 2021-08-11 DIAGNOSIS — K219 Gastro-esophageal reflux disease without esophagitis: Secondary | ICD-10-CM | POA: Diagnosis not present

## 2021-08-19 DIAGNOSIS — R059 Cough, unspecified: Secondary | ICD-10-CM | POA: Diagnosis not present

## 2021-08-19 DIAGNOSIS — J300 Vasomotor rhinitis: Secondary | ICD-10-CM | POA: Diagnosis not present

## 2021-09-09 ENCOUNTER — Other Ambulatory Visit: Payer: Self-pay

## 2021-09-09 ENCOUNTER — Ambulatory Visit (INDEPENDENT_AMBULATORY_CARE_PROVIDER_SITE_OTHER): Payer: PPO | Admitting: Vascular Surgery

## 2021-09-09 ENCOUNTER — Encounter (INDEPENDENT_AMBULATORY_CARE_PROVIDER_SITE_OTHER): Payer: Self-pay | Admitting: Vascular Surgery

## 2021-09-09 ENCOUNTER — Ambulatory Visit (INDEPENDENT_AMBULATORY_CARE_PROVIDER_SITE_OTHER): Payer: PPO

## 2021-09-09 VITALS — BP 199/110 | HR 75 | Resp 16 | Wt 153.6 lb

## 2021-09-09 DIAGNOSIS — I6523 Occlusion and stenosis of bilateral carotid arteries: Secondary | ICD-10-CM

## 2021-09-09 DIAGNOSIS — E785 Hyperlipidemia, unspecified: Secondary | ICD-10-CM | POA: Diagnosis not present

## 2021-09-09 DIAGNOSIS — I10 Essential (primary) hypertension: Secondary | ICD-10-CM | POA: Diagnosis not present

## 2021-09-09 NOTE — Assessment & Plan Note (Signed)
Carotid duplex today reveals stable 40 to 59% right ICA stenosis and 1 to 39% left ICA stenosis.  The velocities are on the upper end of the range on the right, but have not progressed significantly from 6 months ago.  She is still tolerating Crestor with some leg pain.  Continue antiplatelet therapy.  Recheck in 6 months. ?

## 2021-09-09 NOTE — Progress Notes (Signed)
? ? ?MRN : [id number] ? ?Patricia Bullock is a 76 y.o. ([birth year]) female who presents with chief complaint of  ?Chief Complaint  ?Patient presents with  ? Follow-up  ?  Ultrasound follow up  ?. ? ?History of Present Illness: patient returns in follow up of her carotid disease.  She is doing well. No complaints.  Is tolerating her Crestor 5 mg ok at this point. No focal neurologic symptoms. Specifically, the patient denies amaurosis fugax, speech or swallowing difficulties, or arm or leg weakness or numbness. Carotid duplex today reveals stable 40 to 59% right ICA stenosis and 1 to 39% left ICA stenosis.  The velocities are on the upper end of the range on the right, but have not progressed significantly from 6 months ago.  ? ?Current Outpatient Medications  ?Medication Sig Dispense Refill  ? aspirin 81 MG tablet Take 81 mg by mouth daily.    ? azelastine (ASTELIN) 0.1 % nasal spray Place into both nostrils 2 (two) times daily. Use in each nostril as directed    ? clonazePAM (KLONOPIN) 0.5 MG tablet TAKE 1 TABLET BY MOUTH 3 TIMES DAILY AS NEEDED 90 tablet 5  ? Coenzyme Q10 100 MG capsule Take 200 mg by mouth daily.     ? Dexlansoprazole (DEXILANT) 30 MG capsule Take 1 capsule (30 mg total) by mouth daily. 90 capsule 3  ? hydrALAZINE (APRESOLINE) 25 MG tablet Take 25 mg by mouth 3 (three) times daily.    ? ipratropium (ATROVENT) 0.03 % nasal spray     ? losartan (COZAAR) 50 MG tablet TAKE 1 TABLET BY MOUTH DAILY (Patient taking differently: 100 mg daily.) 90 tablet 0  ? meloxicam (MOBIC) 7.5 MG tablet Take 7.5 mg by mouth daily.    ? Misc Natural Products (OSTEO BI-FLEX JOINT SHIELD PO) Take by mouth.    ? montelukast (SINGULAIR) 10 MG tablet Take 1 tablet (10 mg total) by mouth at bedtime. 90 tablet 3  ? propranolol (INDERAL) 40 MG tablet TAKE 1 TABLET BY MOUTH TWICE DAILY 30 tablet 0  ? rosuvastatin (CRESTOR) 5 MG tablet TAKE 1 TABLET DAILY 30 tablet 1  ? spironolactone (ALDACTONE) 25 MG tablet Take by mouth. Takes  12.5 mg daily    ? ALLERGY RELIEF 180 MG tablet Take 180 mg by mouth every morning.    ? desloratadine (CLARINEX) 5 MG tablet Take 5 mg by mouth daily.    ? diclofenac sodium (VOLTAREN) 1 % GEL Apply 2 g topically 4 (four) times daily. 100 g 5  ? Probiotic Product (PROBIOTIC DAILY PO) Take by mouth daily.    ? ?No current facility-administered medications for this visit.  ? ? ?Past Medical History:  ?Diagnosis Date  ? Allergic rhinitis   ? Anxiety   ? Arthritis   ? Fibroids 05/13/1990  ? Leiomyoma of Uterus  ? GERD (gastroesophageal reflux disease)   ? Headache(784.0)   ? Hyperlipidemia   ? Hypertension   ? Panic disorder   ? ? ?Past Surgical History:  ?Procedure Laterality Date  ? benign skin lesions removed    ? BREAST CYST ASPIRATION Right   ? NEG  ? COLONOSCOPY  05-23-13  ? per Dr. Sharlett Iles, adenomatous polyps, repeat in 5 yrs   ? DILATION AND CURETTAGE OF UTERUS  1992  ? POLYPECTOMY    ? ? ? ?Social History  ? ?Tobacco Use  ? Smoking status: Never  ? Smokeless tobacco: Never  ?Substance Use Topics  ? Alcohol use: No  ?  Alcohol/week: 0.0 standard drinks  ? Drug use: No  ? ? ? ? ?Family History  ?Problem Relation Age of Onset  ? Kidney cancer Mother 76  ?     family history also  ? Diabetes Mother   ?     Type 2  ? Hypertension Mother   ? Diabetes Other   ? Hyperlipidemia Other   ? Hypertension Other   ? Cancer Other   ? Dementia Father   ? Colon cancer Maternal Aunt   ? Breast cancer Maternal Aunt   ? Esophageal cancer Neg Hx   ? Rectal cancer Neg Hx   ? Stomach cancer Neg Hx   ? ? ? ?Allergies  ?Allergen Reactions  ? Ace Inhibitors   ?  REACTION: cough  ? Amlodipine Besylate   ?  REACTION: swelling  ? Citalopram Hydrobromide Itching  ? Clonidine Hydrochloride   ?  REACTION: sleepiness  ? Ezetimibe   ?  REACTION: muscle aches  ? Statins   ?  REACTION: muscle aches  ? Penicillins Rash  ? ? ? ?REVIEW OF SYSTEMS (Negative unless checked) ? ?Constitutional: '[]'$ Weight loss  '[]'$ Fever  '[]'$ Chills ?Cardiac: '[]'$ Chest pain    '[]'$ Chest pressure   '[]'$ Palpitations   '[]'$ Shortness of breath when laying flat   '[]'$ Shortness of breath at rest   '[]'$ Shortness of breath with exertion. ?Vascular:  '[]'$ Pain in legs with walking   '[]'$ Pain in legs at rest   '[]'$ Pain in legs when laying flat   '[]'$ Claudication   '[]'$ Pain in feet when walking  '[]'$ Pain in feet at rest  '[]'$ Pain in feet when laying flat   '[]'$ History of DVT   '[]'$ Phlebitis   '[]'$ Swelling in legs   '[]'$ Varicose veins   '[]'$ Non-healing ulcers ?Pulmonary:   '[]'$ Uses home oxygen   '[]'$ Productive cough   '[]'$ Hemoptysis   '[]'$ Wheeze  '[]'$ COPD   '[]'$ Asthma ?Neurologic:  '[]'$ Dizziness  '[]'$ Blackouts   '[]'$ Seizures   '[]'$ History of stroke   '[]'$ History of TIA  '[]'$ Aphasia   '[]'$ Temporary blindness   '[]'$ Dysphagia   '[]'$ Weakness or numbness in arms   '[]'$ Weakness or numbness in legs ?Musculoskeletal:  '[x]'$ Arthritis   '[]'$ Joint swelling   '[x]'$ Joint pain   '[]'$ Low back pain ?Hematologic:  '[]'$ Easy bruising  '[]'$ Easy bleeding   '[]'$ Hypercoagulable state   '[]'$ Anemic  '[]'$ Hepatitis ?Gastrointestinal:  '[]'$ Blood in stool   '[]'$ Vomiting blood  '[]'$ Gastroesophageal reflux/heartburn   '[]'$ Difficulty swallowing. ?Genitourinary:  '[]'$ Chronic kidney disease   '[]'$ Difficult urination  '[]'$ Frequent urination  '[]'$ Burning with urination   '[]'$ Blood in urine ?Skin:  '[]'$ Rashes   '[]'$ Ulcers   '[]'$ Wounds ?Psychological:  '[]'$ History of anxiety   '[]'$  History of major depression. ? ?Physical Examination ? ?Vitals:  ? 09/09/21 0843  ?BP: (!) 199/110  ?Pulse: 75  ?Resp: 16  ?Weight: 153 lb 9.6 oz (69.7 kg)  ? ?Body mass index is 25.56 kg/m?. ?Gen:  WD/WN, NAD ?Head: [place]/AT, No temporalis wasting. ?Ear/Nose/Throat: Hearing grossly intact, nares w/o erythema or drainage, trachea midline ?Eyes: Conjunctiva clear. Sclera non-icteric ?Neck: Supple.  Soft right bruit  ?Pulmonary:  Good air movement, equal and clear to auscultation bilaterally.  ?Cardiac: RRR, No JVD ?Vascular:  ?Vessel Right Left  ?Radial Palpable Palpable  ?    ? ?Musculoskeletal: M/S 5/5 throughout.  No deformity or atrophy. No edema. ?Neurologic: CN 2-12  intact. Sensation grossly intact in extremities.  Symmetrical.  Speech is fluent. Motor exam as listed above. ?Psychiatric: Judgment intact, Mood & affect appropriate for pt's clinical situation. ?Dermatologic: No rashes or ulcers noted.  No  cellulitis or open wounds. ? ? ? ?CBC ?Lab Results  ?Component Value Date  ? WBC 3.0 (L) 06/08/2017  ? HGB 13.2 06/08/2017  ? HCT 40.6 06/08/2017  ? MCV 90.2 06/08/2017  ? PLT 210.0 06/08/2017  ? ? ?BMET ?   ?Component Value Date/Time  ? NA 143 06/08/2017 0856  ? NA 144 10/09/2011 0015  ? K 4.1 06/08/2017 0856  ? K 3.7 10/09/2011 0015  ? CL 105 06/08/2017 0856  ? CL 108 (H) 10/09/2011 0015  ? CO2 31 06/08/2017 0856  ? CO2 26 10/09/2011 0015  ? GLUCOSE 100 (H) 06/08/2017 0856  ? GLUCOSE 123 (H) 10/09/2011 0015  ? BUN 14 06/08/2017 0856  ? BUN 13 10/09/2011 0015  ? CREATININE 0.74 06/08/2017 0856  ? CREATININE 0.67 10/09/2011 0015  ? CALCIUM 9.2 06/08/2017 0856  ? CALCIUM 9.4 10/09/2011 0015  ? GFRNONAA >60 10/09/2011 0015  ? GFRAA >60 10/09/2011 0015  ? ?CrCl cannot be calculated (Patient's most recent lab result is older than the maximum 21 days allowed.). ? ?COAG ?No results found for: INR, PROTIME ? ?Radiology ?No results found. ? ? ?Assessment/Plan ?Essential hypertension ?blood pressure control important in reducing the progression of atherosclerotic disease. On appropriate oral medications. ?  ?  ?Hyperlipidemia ?lipid control important in reducing the progression of atherosclerotic disease.  On crestor 5 mg five days a week and should continue this if she can.  It does seem to be helping her carotid disease.  I have told her if her symptoms are too disabling to continue, I understand and this will be a decision she will have to make. ? ?Carotid stenosis ?Carotid duplex today reveals stable 40 to 59% right ICA stenosis and 1 to 39% left ICA stenosis.  The velocities are on the upper end of the range on the right, but have not progressed significantly from 6 months ago.  She  is still tolerating Crestor with some leg pain.  Continue antiplatelet therapy.  Recheck in 6 months. ? ? ? ?Leotis Pain, MD ? ?09/09/2021 ?9:27 AM ? ? ? ?This note was created with Dragon medical transcript

## 2021-10-13 DIAGNOSIS — I10 Essential (primary) hypertension: Secondary | ICD-10-CM | POA: Diagnosis not present

## 2021-10-13 DIAGNOSIS — R011 Cardiac murmur, unspecified: Secondary | ICD-10-CM | POA: Diagnosis not present

## 2021-10-13 DIAGNOSIS — I739 Peripheral vascular disease, unspecified: Secondary | ICD-10-CM | POA: Diagnosis not present

## 2021-10-13 DIAGNOSIS — F411 Generalized anxiety disorder: Secondary | ICD-10-CM | POA: Diagnosis not present

## 2021-10-13 DIAGNOSIS — R0609 Other forms of dyspnea: Secondary | ICD-10-CM | POA: Diagnosis not present

## 2021-10-13 DIAGNOSIS — E785 Hyperlipidemia, unspecified: Secondary | ICD-10-CM | POA: Diagnosis not present

## 2021-10-13 DIAGNOSIS — Z79899 Other long term (current) drug therapy: Secondary | ICD-10-CM | POA: Diagnosis not present

## 2021-10-22 DIAGNOSIS — F411 Generalized anxiety disorder: Secondary | ICD-10-CM | POA: Diagnosis not present

## 2021-10-22 DIAGNOSIS — I10 Essential (primary) hypertension: Secondary | ICD-10-CM | POA: Diagnosis not present

## 2021-10-22 DIAGNOSIS — Z0000 Encounter for general adult medical examination without abnormal findings: Secondary | ICD-10-CM | POA: Diagnosis not present

## 2021-10-22 DIAGNOSIS — E7800 Pure hypercholesterolemia, unspecified: Secondary | ICD-10-CM | POA: Diagnosis not present

## 2021-12-10 DIAGNOSIS — M170 Bilateral primary osteoarthritis of knee: Secondary | ICD-10-CM | POA: Diagnosis not present

## 2022-01-08 DIAGNOSIS — Z09 Encounter for follow-up examination after completed treatment for conditions other than malignant neoplasm: Secondary | ICD-10-CM | POA: Diagnosis not present

## 2022-01-08 DIAGNOSIS — D225 Melanocytic nevi of trunk: Secondary | ICD-10-CM | POA: Diagnosis not present

## 2022-01-08 DIAGNOSIS — D2261 Melanocytic nevi of right upper limb, including shoulder: Secondary | ICD-10-CM | POA: Diagnosis not present

## 2022-01-08 DIAGNOSIS — Z872 Personal history of diseases of the skin and subcutaneous tissue: Secondary | ICD-10-CM | POA: Diagnosis not present

## 2022-01-08 DIAGNOSIS — L728 Other follicular cysts of the skin and subcutaneous tissue: Secondary | ICD-10-CM | POA: Diagnosis not present

## 2022-01-08 DIAGNOSIS — D2271 Melanocytic nevi of right lower limb, including hip: Secondary | ICD-10-CM | POA: Diagnosis not present

## 2022-01-08 DIAGNOSIS — L821 Other seborrheic keratosis: Secondary | ICD-10-CM | POA: Diagnosis not present

## 2022-01-08 DIAGNOSIS — D2262 Melanocytic nevi of left upper limb, including shoulder: Secondary | ICD-10-CM | POA: Diagnosis not present

## 2022-01-08 DIAGNOSIS — D2272 Melanocytic nevi of left lower limb, including hip: Secondary | ICD-10-CM | POA: Diagnosis not present

## 2022-02-20 DIAGNOSIS — K219 Gastro-esophageal reflux disease without esophagitis: Secondary | ICD-10-CM | POA: Diagnosis not present

## 2022-02-20 DIAGNOSIS — H6123 Impacted cerumen, bilateral: Secondary | ICD-10-CM | POA: Diagnosis not present

## 2022-03-13 ENCOUNTER — Encounter (INDEPENDENT_AMBULATORY_CARE_PROVIDER_SITE_OTHER): Payer: Self-pay | Admitting: Vascular Surgery

## 2022-03-13 ENCOUNTER — Ambulatory Visit (INDEPENDENT_AMBULATORY_CARE_PROVIDER_SITE_OTHER): Payer: PPO | Admitting: Vascular Surgery

## 2022-03-13 ENCOUNTER — Ambulatory Visit (INDEPENDENT_AMBULATORY_CARE_PROVIDER_SITE_OTHER): Payer: PPO

## 2022-03-13 VITALS — BP 194/105 | HR 66 | Resp 17 | Ht 65.0 in | Wt 140.6 lb

## 2022-03-13 DIAGNOSIS — I6523 Occlusion and stenosis of bilateral carotid arteries: Secondary | ICD-10-CM

## 2022-03-13 DIAGNOSIS — E785 Hyperlipidemia, unspecified: Secondary | ICD-10-CM

## 2022-03-13 DIAGNOSIS — I10 Essential (primary) hypertension: Secondary | ICD-10-CM

## 2022-03-13 NOTE — Progress Notes (Signed)
MRN : [id number]  Patricia Bullock is a 76 y.o. ([birth year]) female who presents with chief complaint of No chief complaint on file. Marland Kitchen  History of Present Illness: Patient returns in follow-up of her carotid disease.  She is doing well today without any focal neurologic symptoms.  She has no new complaints. Specifically, the patient denies amaurosis fugax, speech or swallowing difficulties, or arm or leg weakness or numbness. Carotid duplex today demonstrates progression bilaterally with velocities now in the 60 to 79% range on the right in the 40 to 59% range on the left.  Current Outpatient Medications  Medication Sig Dispense Refill   aspirin 81 MG tablet Take 81 mg by mouth daily.     azelastine (ASTELIN) 0.1 % nasal spray Place into both nostrils 2 (two) times daily. Use in each nostril as directed     Calcium Carb-Cholecalciferol 600-10 MG-MCG TABS Take by mouth.     clonazePAM (KLONOPIN) 0.5 MG tablet TAKE 1 TABLET BY MOUTH 3 TIMES DAILY AS NEEDED 90 tablet 5   Coenzyme Q10 100 MG capsule Take 200 mg by mouth daily.      Dexlansoprazole (DEXILANT) 30 MG capsule Take 1 capsule (30 mg total) by mouth daily. 90 capsule 3   ipratropium (ATROVENT) 0.03 % nasal spray      levocetirizine (XYZAL) 5 MG tablet      losartan (COZAAR) 50 MG tablet TAKE 1 TABLET BY MOUTH DAILY (Patient taking differently: 100 mg daily.) 90 tablet 0   meloxicam (MOBIC) 7.5 MG tablet Take 7.5 mg by mouth as needed.     Misc Natural Products (OSTEO BI-FLEX JOINT SHIELD PO) Take by mouth.     montelukast (SINGULAIR) 10 MG tablet Take 1 tablet (10 mg total) by mouth at bedtime. 90 tablet 3   propranolol (INDERAL) 40 MG tablet TAKE 1 TABLET BY MOUTH TWICE DAILY 30 tablet 0   rosuvastatin (CRESTOR) 5 MG tablet TAKE 1 TABLET DAILY 30 tablet 1   spironolactone (ALDACTONE) 25 MG tablet Take by mouth. Takes 12.5 mg daily     No current facility-administered medications for this visit.    Past Medical History:  Diagnosis  Date   Allergic rhinitis    Anxiety    Arthritis    Fibroids 05/13/1990   Leiomyoma of Uterus   GERD (gastroesophageal reflux disease)    Headache(784.0)    Hyperlipidemia    Hypertension    Panic disorder     Past Surgical History:  Procedure Laterality Date   benign skin lesions removed     BREAST CYST ASPIRATION Right    NEG   COLONOSCOPY  05-23-13   per Dr. Sharlett Iles, adenomatous polyps, repeat in 5 yrs    DILATION AND CURETTAGE OF UTERUS  1992   POLYPECTOMY       Social History   Tobacco Use   Smoking status: Never   Smokeless tobacco: Never  Substance Use Topics   Alcohol use: No    Alcohol/week: 0.0 standard drinks of alcohol   Drug use: No      Family History  Problem Relation Age of Onset   Kidney cancer Mother 64       family history also   Diabetes Mother        Type 2   Hypertension Mother    Diabetes Other    Hyperlipidemia Other    Hypertension Other    Cancer Other    Dementia Father    Colon cancer Maternal Aunt  Breast cancer Maternal Aunt    Esophageal cancer Neg Hx    Rectal cancer Neg Hx    Stomach cancer Neg Hx      Allergies  Allergen Reactions   Ace Inhibitors     REACTION: cough   Amlodipine Besylate     REACTION: swelling   Citalopram Hydrobromide Itching   Clonidine Hydrochloride     REACTION: sleepiness   Ezetimibe     REACTION: muscle aches   Statins     REACTION: muscle aches   Penicillins Rash     REVIEW OF SYSTEMS (Negative unless checked)  Constitutional: '[]'$ Weight loss  '[]'$ Fever  '[]'$ Chills Cardiac: '[]'$ Chest pain   '[]'$ Chest pressure   '[]'$ Palpitations   '[]'$ Shortness of breath when laying flat   '[]'$ Shortness of breath at rest   '[]'$ Shortness of breath with exertion. Vascular:  '[]'$ Pain in legs with walking   '[]'$ Pain in legs at rest   '[]'$ Pain in legs when laying flat   '[]'$ Claudication   '[]'$ Pain in feet when walking  '[]'$ Pain in feet at rest  '[]'$ Pain in feet when laying flat   '[]'$ History of DVT   '[]'$ Phlebitis   '[]'$ Swelling in legs    '[]'$ Varicose veins   '[]'$ Non-healing ulcers Pulmonary:   '[]'$ Uses home oxygen   '[]'$ Productive cough   '[]'$ Hemoptysis   '[]'$ Wheeze  '[]'$ COPD   '[]'$ Asthma Neurologic:  '[]'$ Dizziness  '[]'$ Blackouts   '[]'$ Seizures   '[]'$ History of stroke   '[]'$ History of TIA  '[]'$ Aphasia   '[]'$ Temporary blindness   '[]'$ Dysphagia   '[]'$ Weakness or numbness in arms   '[]'$ Weakness or numbness in legs Musculoskeletal:  '[x]'$ Arthritis   '[]'$ Joint swelling   '[x]'$ Joint pain   '[]'$ Low back pain Hematologic:  '[]'$ Easy bruising  '[]'$ Easy bleeding   '[]'$ Hypercoagulable state   '[]'$ Anemic  '[]'$ Hepatitis Gastrointestinal:  '[]'$ Blood in stool   '[]'$ Vomiting blood  '[]'$ Gastroesophageal reflux/heartburn   '[]'$ Difficulty swallowing. Genitourinary:  '[]'$ Chronic kidney disease   '[]'$ Difficult urination  '[]'$ Frequent urination  '[]'$ Burning with urination   '[]'$ Blood in urine Skin:  '[]'$ Rashes   '[]'$ Ulcers   '[]'$ Wounds Psychological:  '[]'$ History of anxiety   '[]'$  History of major depression.  Physical Examination  Vitals:   03/13/22 0831  BP: (!) 194/105  Pulse: 66  Resp: 17  Weight: 140 lb 9.6 oz (63.8 kg)  Height: '5\' 5"'$  (1.651 m)   Body mass index is 23.4 kg/m. Gen:  WD/WN, NAD.  Appears younger than stated age Head: [place]/AT, No temporalis wasting. Ear/Nose/Throat: Hearing grossly intact, nares w/o erythema or drainage, trachea midline Eyes: Conjunctiva clear. Sclera non-icteric Neck: Supple.  Right carotid bruit is present Pulmonary:  Good air movement, equal and clear to auscultation bilaterally.  Cardiac: RRR, No JVD Vascular:  Vessel Right Left  Radial Palpable Palpable          .  Musculoskeletal: M/S 5/5 throughout.  No deformity or atrophy.  No edema. Neurologic: CN 2-12 intact. Sensation grossly intact in extremities.  Symmetrical.  Speech is fluent. Motor exam as listed above. Psychiatric: Judgment intact, Mood & affect appropriate for pt's clinical situation. Dermatologic: No rashes or ulcers noted.  No cellulitis or open wounds.     CBC Lab Results  Component Value Date   WBC  3.0 (L) 06/08/2017   HGB 13.2 06/08/2017   HCT 40.6 06/08/2017   MCV 90.2 06/08/2017   PLT 210.0 06/08/2017    BMET    Component Value Date/Time   NA 143 06/08/2017 0856   NA 144 10/09/2011 0015   K 4.1 06/08/2017 0856  K 3.7 10/09/2011 0015   CL 105 06/08/2017 0856   CL 108 (H) 10/09/2011 0015   CO2 31 06/08/2017 0856   CO2 26 10/09/2011 0015   GLUCOSE 100 (H) 06/08/2017 0856   GLUCOSE 123 (H) 10/09/2011 0015   BUN 14 06/08/2017 0856   BUN 13 10/09/2011 0015   CREATININE 0.74 06/08/2017 0856   CREATININE 0.67 10/09/2011 0015   CALCIUM 9.2 06/08/2017 0856   CALCIUM 9.4 10/09/2011 0015   GFRNONAA >60 10/09/2011 0015   GFRAA >60 10/09/2011 0015   CrCl cannot be calculated (Patient's most recent lab result is older than the maximum 21 days allowed.).  COAG No results found for: "INR", "PROTIME"  Radiology No results found.   Assessment/Plan Essential hypertension blood pressure control important in reducing the progression of atherosclerotic disease. On appropriate oral medications.     Hyperlipidemia lipid control important in reducing the progression of atherosclerotic disease.  On crestor 5 mg five days a week and should continue this if she can.  It does seem to be helping her carotid disease.  I have told her if her symptoms are too disabling to continue, I understand and this will be a decision she will have to make.  Carotid stenosis Carotid duplex today demonstrates progression bilaterally with velocities now in the 60 to 79% range on the right in the 40 to 59% range on the left.  Remains asymptomatic.  Tolerating Crestor somewhat as well as her antiplatelet therapy.  No role for intervention at this level, but we will continue to follow this relatively closely on 46-monthintervals.  Contact our office with any problems in the interim.    JLeotis Pain MD  03/13/2022 9:13 AM    This note was created with Dragon medical transcription system.  Any errors from  dictation are purely unintentional

## 2022-03-13 NOTE — Assessment & Plan Note (Signed)
Carotid duplex today demonstrates progression bilaterally with velocities now in the 60 to 79% range on the right in the 40 to 59% range on the left.  Remains asymptomatic.  Tolerating Crestor somewhat as well as her antiplatelet therapy.  No role for intervention at this level, but we will continue to follow this relatively closely on 76-monthintervals.  Contact our office with any problems in the interim.

## 2022-04-08 DIAGNOSIS — M170 Bilateral primary osteoarthritis of knee: Secondary | ICD-10-CM | POA: Diagnosis not present

## 2022-04-14 DIAGNOSIS — F411 Generalized anxiety disorder: Secondary | ICD-10-CM | POA: Diagnosis not present

## 2022-04-14 DIAGNOSIS — R011 Cardiac murmur, unspecified: Secondary | ICD-10-CM | POA: Diagnosis not present

## 2022-04-14 DIAGNOSIS — E785 Hyperlipidemia, unspecified: Secondary | ICD-10-CM | POA: Diagnosis not present

## 2022-04-14 DIAGNOSIS — I739 Peripheral vascular disease, unspecified: Secondary | ICD-10-CM | POA: Diagnosis not present

## 2022-04-14 DIAGNOSIS — E7800 Pure hypercholesterolemia, unspecified: Secondary | ICD-10-CM | POA: Diagnosis not present

## 2022-04-14 DIAGNOSIS — R0609 Other forms of dyspnea: Secondary | ICD-10-CM | POA: Diagnosis not present

## 2022-04-14 DIAGNOSIS — I10 Essential (primary) hypertension: Secondary | ICD-10-CM | POA: Diagnosis not present

## 2022-04-14 DIAGNOSIS — K219 Gastro-esophageal reflux disease without esophagitis: Secondary | ICD-10-CM | POA: Diagnosis not present

## 2022-04-14 DIAGNOSIS — J4520 Mild intermittent asthma, uncomplicated: Secondary | ICD-10-CM | POA: Diagnosis not present

## 2022-04-20 ENCOUNTER — Encounter (INDEPENDENT_AMBULATORY_CARE_PROVIDER_SITE_OTHER): Payer: Self-pay

## 2022-04-24 DIAGNOSIS — I10 Essential (primary) hypertension: Secondary | ICD-10-CM | POA: Diagnosis not present

## 2022-04-30 ENCOUNTER — Telehealth (INDEPENDENT_AMBULATORY_CARE_PROVIDER_SITE_OTHER): Payer: Self-pay

## 2022-04-30 NOTE — Telephone Encounter (Signed)
He is not as familiar with Repatha, however if there are issues with her cholesterol control, this is a good recommendation.

## 2022-04-30 NOTE — Telephone Encounter (Signed)
Pt called and LVM on 11-6 her PCP would like to stop her Crestor and put her on Repatha, she would like to know what Dr Lucky Cowboy thinks about this whether it is ok or not.

## 2022-05-01 NOTE — Telephone Encounter (Signed)
Pt made aware and states understanding.

## 2022-05-12 DIAGNOSIS — H469 Unspecified optic neuritis: Secondary | ICD-10-CM | POA: Diagnosis not present

## 2022-05-13 DIAGNOSIS — H4710 Unspecified papilledema: Secondary | ICD-10-CM | POA: Diagnosis not present

## 2022-05-13 DIAGNOSIS — I119 Hypertensive heart disease without heart failure: Secondary | ICD-10-CM | POA: Diagnosis not present

## 2022-05-13 DIAGNOSIS — H5461 Unqualified visual loss, right eye, normal vision left eye: Secondary | ICD-10-CM | POA: Diagnosis not present

## 2022-05-13 DIAGNOSIS — E7800 Pure hypercholesterolemia, unspecified: Secondary | ICD-10-CM | POA: Diagnosis not present

## 2022-05-13 DIAGNOSIS — I6529 Occlusion and stenosis of unspecified carotid artery: Secondary | ICD-10-CM | POA: Diagnosis not present

## 2022-05-13 DIAGNOSIS — H47011 Ischemic optic neuropathy, right eye: Secondary | ICD-10-CM | POA: Diagnosis not present

## 2022-05-13 DIAGNOSIS — Z79899 Other long term (current) drug therapy: Secondary | ICD-10-CM | POA: Diagnosis not present

## 2022-05-13 DIAGNOSIS — F411 Generalized anxiety disorder: Secondary | ICD-10-CM | POA: Diagnosis not present

## 2022-05-13 DIAGNOSIS — Z7982 Long term (current) use of aspirin: Secondary | ICD-10-CM | POA: Diagnosis not present

## 2022-05-13 DIAGNOSIS — H539 Unspecified visual disturbance: Secondary | ICD-10-CM | POA: Diagnosis not present

## 2022-05-13 DIAGNOSIS — R9431 Abnormal electrocardiogram [ECG] [EKG]: Secondary | ICD-10-CM | POA: Diagnosis not present

## 2022-05-13 DIAGNOSIS — K219 Gastro-esophageal reflux disease without esophagitis: Secondary | ICD-10-CM | POA: Diagnosis not present

## 2022-05-13 DIAGNOSIS — I10 Essential (primary) hypertension: Secondary | ICD-10-CM | POA: Diagnosis not present

## 2022-05-14 DIAGNOSIS — H539 Unspecified visual disturbance: Secondary | ICD-10-CM | POA: Diagnosis not present

## 2022-05-18 DIAGNOSIS — H538 Other visual disturbances: Secondary | ICD-10-CM | POA: Diagnosis not present

## 2022-06-02 ENCOUNTER — Other Ambulatory Visit: Payer: Self-pay | Admitting: Family Medicine

## 2022-06-02 DIAGNOSIS — Z1231 Encounter for screening mammogram for malignant neoplasm of breast: Secondary | ICD-10-CM

## 2022-06-10 DIAGNOSIS — H4710 Unspecified papilledema: Secondary | ICD-10-CM | POA: Diagnosis not present

## 2022-07-01 ENCOUNTER — Ambulatory Visit
Admission: RE | Admit: 2022-07-01 | Discharge: 2022-07-01 | Disposition: A | Discharge status: Home / Self Care | Payer: PPO | Source: Ambulatory Visit | Attending: Family Medicine | Admitting: Family Medicine

## 2022-07-01 DIAGNOSIS — Z1231 Encounter for screening mammogram for malignant neoplasm of breast: Secondary | ICD-10-CM | POA: Insufficient documentation

## 2022-07-22 DIAGNOSIS — H47011 Ischemic optic neuropathy, right eye: Secondary | ICD-10-CM | POA: Diagnosis not present

## 2022-07-22 DIAGNOSIS — H538 Other visual disturbances: Secondary | ICD-10-CM | POA: Diagnosis not present

## 2022-07-27 DIAGNOSIS — F411 Generalized anxiety disorder: Secondary | ICD-10-CM | POA: Diagnosis not present

## 2022-08-13 DIAGNOSIS — H47011 Ischemic optic neuropathy, right eye: Secondary | ICD-10-CM | POA: Diagnosis not present

## 2022-08-20 DIAGNOSIS — M170 Bilateral primary osteoarthritis of knee: Secondary | ICD-10-CM | POA: Diagnosis not present

## 2022-08-21 DIAGNOSIS — H6123 Impacted cerumen, bilateral: Secondary | ICD-10-CM | POA: Diagnosis not present

## 2022-08-21 DIAGNOSIS — K219 Gastro-esophageal reflux disease without esophagitis: Secondary | ICD-10-CM | POA: Diagnosis not present

## 2022-08-26 DIAGNOSIS — J4520 Mild intermittent asthma, uncomplicated: Secondary | ICD-10-CM | POA: Diagnosis not present

## 2022-08-26 DIAGNOSIS — I739 Peripheral vascular disease, unspecified: Secondary | ICD-10-CM | POA: Diagnosis not present

## 2022-08-26 DIAGNOSIS — K219 Gastro-esophageal reflux disease without esophagitis: Secondary | ICD-10-CM | POA: Diagnosis not present

## 2022-08-26 DIAGNOSIS — I10 Essential (primary) hypertension: Secondary | ICD-10-CM | POA: Diagnosis not present

## 2022-08-26 DIAGNOSIS — F411 Generalized anxiety disorder: Secondary | ICD-10-CM | POA: Diagnosis not present

## 2022-08-26 DIAGNOSIS — R011 Cardiac murmur, unspecified: Secondary | ICD-10-CM | POA: Diagnosis not present

## 2022-08-26 DIAGNOSIS — E785 Hyperlipidemia, unspecified: Secondary | ICD-10-CM | POA: Diagnosis not present

## 2022-09-04 DIAGNOSIS — R059 Cough, unspecified: Secondary | ICD-10-CM | POA: Diagnosis not present

## 2022-09-04 DIAGNOSIS — J300 Vasomotor rhinitis: Secondary | ICD-10-CM | POA: Diagnosis not present

## 2022-09-09 DIAGNOSIS — M25662 Stiffness of left knee, not elsewhere classified: Secondary | ICD-10-CM | POA: Diagnosis not present

## 2022-09-11 ENCOUNTER — Ambulatory Visit (INDEPENDENT_AMBULATORY_CARE_PROVIDER_SITE_OTHER): Payer: PPO

## 2022-09-11 ENCOUNTER — Ambulatory Visit (INDEPENDENT_AMBULATORY_CARE_PROVIDER_SITE_OTHER): Payer: PPO | Admitting: Nurse Practitioner

## 2022-09-11 ENCOUNTER — Encounter (INDEPENDENT_AMBULATORY_CARE_PROVIDER_SITE_OTHER): Payer: Self-pay | Admitting: Vascular Surgery

## 2022-09-11 VITALS — BP 198/98 | HR 60 | Resp 18 | Ht 65.0 in | Wt 138.6 lb

## 2022-09-11 DIAGNOSIS — E785 Hyperlipidemia, unspecified: Secondary | ICD-10-CM

## 2022-09-11 DIAGNOSIS — I6523 Occlusion and stenosis of bilateral carotid arteries: Secondary | ICD-10-CM

## 2022-09-11 DIAGNOSIS — I10 Essential (primary) hypertension: Secondary | ICD-10-CM

## 2022-09-28 ENCOUNTER — Encounter (INDEPENDENT_AMBULATORY_CARE_PROVIDER_SITE_OTHER): Payer: Self-pay | Admitting: Nurse Practitioner

## 2022-09-28 NOTE — Progress Notes (Signed)
MRN : [id number]  Patricia Bullock is a 77 y.o. ([birth year]) female who presents with chief complaint of  Chief Complaint  Patient presents with   Follow-up    f/u in 6 months with carotid  .  History of Present Illness: Patient returns in follow-up of her carotid disease.  She is doing well today without any focal neurologic symptoms.  She has no new complaints. Specifically, the patient denies amaurosis fugax, speech or swallowing difficulties, or arm or leg weakness or numbness. Carotid duplex today shows 1 to 39% stenosis of the left ICA with 40 to 59% stenosis of the right.  Previously the patient had 60 to 79% stenosis of the right ICA with 40-59 of the left..  Current Outpatient Medications  Medication Sig Dispense Refill   aspirin 81 MG tablet Take 81 mg by mouth daily.     Calcium Carb-Cholecalciferol 600-10 MG-MCG TABS Take by mouth.     clonazePAM (KLONOPIN) 0.5 MG tablet TAKE 1 TABLET BY MOUTH 3 TIMES DAILY AS NEEDED 90 tablet 5   Coenzyme Q10 100 MG capsule Take 200 mg by mouth daily.      Dexlansoprazole (DEXILANT) 30 MG capsule Take 1 capsule (30 mg total) by mouth daily. 90 capsule 3   labetalol (NORMODYNE) 100 MG tablet Take 100 mg by mouth 2 (two) times daily.     levocetirizine (XYZAL) 5 MG tablet      losartan (COZAAR) 50 MG tablet TAKE 1 TABLET BY MOUTH DAILY (Patient taking differently: 100 mg daily.) 90 tablet 0   Misc Natural Products (OSTEO BI-FLEX JOINT SHIELD PO) Take by mouth.     montelukast (SINGULAIR) 10 MG tablet Take 1 tablet (10 mg total) by mouth at bedtime. 90 tablet 3   rosuvastatin (CRESTOR) 5 MG tablet TAKE 1 TABLET DAILY 30 tablet 1   azelastine (ASTELIN) 0.1 % nasal spray Place into both nostrils 2 (two) times daily. Use in each nostril as directed     ipratropium (ATROVENT) 0.03 % nasal spray      meloxicam (MOBIC) 7.5 MG tablet Take 7.5 mg by mouth as needed.     propranolol (INDERAL) 40 MG tablet TAKE 1 TABLET BY MOUTH TWICE DAILY 30 tablet 0    spironolactone (ALDACTONE) 25 MG tablet Take by mouth. Takes 12.5 mg daily     No current facility-administered medications for this visit.    Past Medical History:  Diagnosis Date   Allergic rhinitis    Anxiety    Arthritis    Fibroids 05/13/1990   Leiomyoma of Uterus   GERD (gastroesophageal reflux disease)    Headache(784.0)    Hyperlipidemia    Hypertension    Panic disorder     Past Surgical History:  Procedure Laterality Date   benign skin lesions removed     BREAST CYST ASPIRATION Right    NEG   COLONOSCOPY  05-23-13   per Dr. Jarold Motto, adenomatous polyps, repeat in 5 yrs    DILATION AND CURETTAGE OF UTERUS  1992   POLYPECTOMY       Social History   Tobacco Use   Smoking status: Never   Smokeless tobacco: Never  Substance Use Topics   Alcohol use: No    Alcohol/week: 0.0 standard drinks of alcohol   Drug use: No      Family History  Problem Relation Age of Onset   Kidney cancer Mother 22       family history also   Diabetes Mother  Type 2   Hypertension Mother    Diabetes Other    Hyperlipidemia Other    Hypertension Other    Cancer Other    Dementia Father    Colon cancer Maternal Aunt    Breast cancer Maternal Aunt    Esophageal cancer Neg Hx    Rectal cancer Neg Hx    Stomach cancer Neg Hx      Allergies  Allergen Reactions   Ace Inhibitors     REACTION: cough   Amlodipine Besylate     REACTION: swelling   Citalopram Hydrobromide Itching   Clonidine Hydrochloride     REACTION: sleepiness   Ezetimibe     REACTION: muscle aches   Statins     REACTION: muscle aches   Penicillins Rash     REVIEW OF SYSTEMS (Negative unless checked)  Constitutional: [] Weight loss  [] Fever  [] Chills Cardiac: [] Chest pain   [] Chest pressure   [] Palpitations   [] Shortness of breath when laying flat   [] Shortness of breath at rest   [] Shortness of breath with exertion. Vascular:  [] Pain in legs with walking   [] Pain in legs at rest   [] Pain  in legs when laying flat   [] Claudication   [] Pain in feet when walking  [] Pain in feet at rest  [] Pain in feet when laying flat   [] History of DVT   [] Phlebitis   [] Swelling in legs   [] Varicose veins   [] Non-healing ulcers Pulmonary:   [] Uses home oxygen   [] Productive cough   [] Hemoptysis   [] Wheeze  [] COPD   [] Asthma Neurologic:  [] Dizziness  [] Blackouts   [] Seizures   [] History of stroke   [] History of TIA  [] Aphasia   [] Temporary blindness   [] Dysphagia   [] Weakness or numbness in arms   [] Weakness or numbness in legs Musculoskeletal:  [x] Arthritis   [] Joint swelling   [x] Joint pain   [] Low back pain Hematologic:  [] Easy bruising  [] Easy bleeding   [] Hypercoagulable state   [] Anemic  [] Hepatitis Gastrointestinal:  [] Blood in stool   [] Vomiting blood  [] Gastroesophageal reflux/heartburn   [] Difficulty swallowing. Genitourinary:  [] Chronic kidney disease   [] Difficult urination  [] Frequent urination  [] Burning with urination   [] Blood in urine Skin:  [] Rashes   [] Ulcers   [] Wounds Psychological:  [] History of anxiety   []  History of major depression.  Physical Examination  Vitals:   09/11/22 0852 09/11/22 0855  BP: (!) 187/91 (!) 198/98  Pulse: 61 60  Resp: 18 18  Weight: 138 lb 9.6 oz (62.9 kg)   Height: 5\' 5"  (1.651 m)    Body mass index is 23.06 kg/m. Gen:  WD/WN, NAD.  Appears younger than stated age Head: [place]/AT, No temporalis wasting. Ear/Nose/Throat: Hearing grossly intact, nares w/o erythema or drainage, trachea midline Eyes: Conjunctiva clear. Sclera non-icteric Neck: Supple.  Right carotid bruit is present Pulmonary:  Good air movement, equal and clear to auscultation bilaterally.  Cardiac: RRR, No JVD Vascular:  Vessel Right Left  Radial Palpable Palpable          .  Musculoskeletal: M/S 5/5 throughout.  No deformity or atrophy.  No edema. Neurologic: CN 2-12 intact. Sensation grossly intact in extremities.  Symmetrical.  Speech is fluent. Motor exam as listed  above. Psychiatric: Judgment intact, Mood & affect appropriate for pt's clinical situation. Dermatologic: No rashes or ulcers noted.  No cellulitis or open wounds.     CBC Lab Results  Component Value Date   WBC 3.0 (L) 06/08/2017  HGB 13.2 06/08/2017   HCT 40.6 06/08/2017   MCV 90.2 06/08/2017   PLT 210.0 06/08/2017    BMET    Component Value Date/Time   NA 143 06/08/2017 0856   NA 144 10/09/2011 0015   K 4.1 06/08/2017 0856   K 3.7 10/09/2011 0015   CL 105 06/08/2017 0856   CL 108 (H) 10/09/2011 0015   CO2 31 06/08/2017 0856   CO2 26 10/09/2011 0015   GLUCOSE 100 (H) 06/08/2017 0856   GLUCOSE 123 (H) 10/09/2011 0015   BUN 14 06/08/2017 0856   BUN 13 10/09/2011 0015   CREATININE 0.74 06/08/2017 0856   CREATININE 0.67 10/09/2011 0015   CALCIUM 9.2 06/08/2017 0856   CALCIUM 9.4 10/09/2011 0015   GFRNONAA >60 10/09/2011 0015   GFRAA >60 10/09/2011 0015   CrCl cannot be calculated (Patient's most recent lab result is older than the maximum 21 days allowed.).  COAG No results found for: "INR", "PROTIME"  Radiology VAS US CAROTID  Result Date: 09/14/2022 Carotid Arterial Duplex Study Patient Name:  ELYCIA ROTHSCHILD Yepiz  Date of Exam:   09/11/2022 Medical Rec #: [id number]     Accession #:    [id number] Date of Birth: Aug 17, 1945     Patient Gender: F Patient Age:   6 years Exam Location:  [facility] Procedure:      VAS US CAROTID Referring Phys: Festus Barren --------------------------------------------------------------------------------  Indications:       Carotid artery disease. Risk Factors:      Hypertension, hyperlipidemia. Comparison Study:  02/2022 Performing Technologist: Salvadore Farber RVT  Examination Guidelines: A complete evaluation includes B-mode imaging, spectral Doppler, color Doppler, and power Doppler as needed of all accessible portions of each vessel. Bilateral testing is considered an integral part of a complete examination. Limited examinations for  reoccurring indications may be performed as noted.  Right Carotid Findings: +----------+--------+--------+--------+------------------+--------+           PSV cm/sEDV cm/sStenosisPlaque DescriptionComments +----------+--------+--------+--------+------------------+--------+ CCA Prox  71      12                                         +----------+--------+--------+--------+------------------+--------+ CCA Mid   81      20                                         +----------+--------+--------+--------+------------------+--------+ CCA Distal63      18                                         +----------+--------+--------+--------+------------------+--------+ ICA Prox  180     42      40-59%  calcific          tortuous +----------+--------+--------+--------+------------------+--------+ ICA Mid   150     33                                         +----------+--------+--------+--------+------------------+--------+ ICA Distal88      32                                         +----------+--------+--------+--------+------------------+--------+  ECA       80      13                                         +----------+--------+--------+--------+------------------+--------+ +----------+--------+-------+----------------+-------------------+           PSV cm/sEDV cmsDescribe        Arm Pressure (mmHG) +----------+--------+-------+----------------+-------------------+ [id number]            Multiphasic, WNL                    +----------+--------+-------+----------------+-------------------+ +---------+--------+--+--------+--+---------+ VertebralPSV cm/s99EDV cm/s35Antegrade +---------+--------+--+--------+--+---------+  Left Carotid Findings: +----------+--------+--------+--------+------------------+--------+           PSV cm/sEDV cm/sStenosisPlaque DescriptionComments +----------+--------+--------+--------+------------------+--------+ CCA Prox  93       18                                         +----------+--------+--------+--------+------------------+--------+ CCA Mid   81      24                                         +----------+--------+--------+--------+------------------+--------+ CCA Distal78      16                                         +----------+--------+--------+--------+------------------+--------+ ICA Prox  115     23                                         +----------+--------+--------+--------+------------------+--------+ ICA Mid   124     42      1-39%   calcific                   +----------+--------+--------+--------+------------------+--------+ ICA Distal110     32                                         +----------+--------+--------+--------+------------------+--------+ ECA       106     10                                         +----------+--------+--------+--------+------------------+--------+ +----------+--------+--------+----------------+-------------------+           PSV cm/sEDV cm/sDescribe        Arm Pressure (mmHG) +----------+--------+--------+----------------+-------------------+ [id number]              Multiphasic, WNL                    +----------+--------+--------+----------------+-------------------+ +---------+--------+--+--------+--+---------+ VertebralPSV cm/s49EDV cm/s15Antegrade +---------+--------+--+--------+--+---------+   Summary: Right Carotid: Velocities in the right ICA are consistent with a 40-59%                stenosis. Non-hemodynamically significant plaque <50% noted in  the CCA. The ECA appears <50% stenosed. Proximal ICA is widely                patent but moderate tortuousity causing the velocities to                elevate. Left Carotid: Velocities in the left ICA are consistent with a 1-39% stenosis.               Non-hemodynamically significant plaque <50% noted in the CCA. The               ECA appears <50% stenosed.  Vertebrals:  Bilateral vertebral arteries demonstrate antegrade flow. Subclavians: Normal flow hemodynamics were seen in bilateral subclavian              arteries. *See table(s) above for measurements and observations.  Electronically signed by Festus Barren MD on 09/14/2022 at 8:40:17 AM.    Final      Assessment/Plan 1. Essential hypertension Continue antihypertensive medications as already ordered, these medications have been reviewed and there are no changes at this time.  2. Hyperlipidemia, unspecified hyperlipidemia type Continue statin as ordered and reviewed, no changes at this time  3. Bilateral carotid artery stenosis Recommend:  Given the patient's asymptomatic subcritical stenosis no further invasive testing or surgery at this time.  The patient's previous study 6 months ago did show an area of 60 to 79% on the right.  Based on this we will continue her 52-month follow-up in order to continue with close follow-up  Continue antiplatelet therapy as prescribed Continue management of CAD, HTN and Hyperlipidemia Healthy heart diet,  encouraged exercise at least 4 times per week Follow up in 6 months with duplex ultrasound and physical exam    No problem-specific Assessment & Plan notes found for this encounter.     Georgiana Spinner, NP  09/28/2022 1:09 AM    This note was created with Dragon medical transcription system.  Any errors from dictation are purely unintentional

## 2022-10-02 DIAGNOSIS — M25662 Stiffness of left knee, not elsewhere classified: Secondary | ICD-10-CM | POA: Diagnosis not present

## 2022-10-08 DIAGNOSIS — M25662 Stiffness of left knee, not elsewhere classified: Secondary | ICD-10-CM | POA: Diagnosis not present

## 2022-10-12 DIAGNOSIS — M25662 Stiffness of left knee, not elsewhere classified: Secondary | ICD-10-CM | POA: Diagnosis not present

## 2022-10-15 DIAGNOSIS — F411 Generalized anxiety disorder: Secondary | ICD-10-CM | POA: Diagnosis not present

## 2022-10-15 DIAGNOSIS — E7800 Pure hypercholesterolemia, unspecified: Secondary | ICD-10-CM | POA: Diagnosis not present

## 2022-10-15 DIAGNOSIS — I10 Essential (primary) hypertension: Secondary | ICD-10-CM | POA: Diagnosis not present

## 2022-10-16 DIAGNOSIS — R011 Cardiac murmur, unspecified: Secondary | ICD-10-CM | POA: Diagnosis not present

## 2022-10-16 DIAGNOSIS — F411 Generalized anxiety disorder: Secondary | ICD-10-CM | POA: Diagnosis not present

## 2022-10-16 DIAGNOSIS — I10 Essential (primary) hypertension: Secondary | ICD-10-CM | POA: Diagnosis not present

## 2022-10-16 DIAGNOSIS — K219 Gastro-esophageal reflux disease without esophagitis: Secondary | ICD-10-CM | POA: Diagnosis not present

## 2022-10-16 DIAGNOSIS — R0609 Other forms of dyspnea: Secondary | ICD-10-CM | POA: Diagnosis not present

## 2022-10-16 DIAGNOSIS — E785 Hyperlipidemia, unspecified: Secondary | ICD-10-CM | POA: Diagnosis not present

## 2022-10-16 DIAGNOSIS — J4520 Mild intermittent asthma, uncomplicated: Secondary | ICD-10-CM | POA: Diagnosis not present

## 2022-10-16 DIAGNOSIS — I739 Peripheral vascular disease, unspecified: Secondary | ICD-10-CM | POA: Diagnosis not present

## 2022-10-19 DIAGNOSIS — M25662 Stiffness of left knee, not elsewhere classified: Secondary | ICD-10-CM | POA: Diagnosis not present

## 2022-10-26 DIAGNOSIS — M25662 Stiffness of left knee, not elsewhere classified: Secondary | ICD-10-CM | POA: Diagnosis not present

## 2022-10-27 DIAGNOSIS — E7800 Pure hypercholesterolemia, unspecified: Secondary | ICD-10-CM | POA: Diagnosis not present

## 2022-10-27 DIAGNOSIS — Z0000 Encounter for general adult medical examination without abnormal findings: Secondary | ICD-10-CM | POA: Diagnosis not present

## 2022-10-27 DIAGNOSIS — I10 Essential (primary) hypertension: Secondary | ICD-10-CM | POA: Diagnosis not present

## 2022-11-06 DIAGNOSIS — K219 Gastro-esophageal reflux disease without esophagitis: Secondary | ICD-10-CM | POA: Diagnosis not present

## 2022-11-06 DIAGNOSIS — I10 Essential (primary) hypertension: Secondary | ICD-10-CM | POA: Diagnosis not present

## 2022-11-06 DIAGNOSIS — E785 Hyperlipidemia, unspecified: Secondary | ICD-10-CM | POA: Diagnosis not present

## 2022-11-06 DIAGNOSIS — R011 Cardiac murmur, unspecified: Secondary | ICD-10-CM | POA: Diagnosis not present

## 2022-11-06 DIAGNOSIS — I739 Peripheral vascular disease, unspecified: Secondary | ICD-10-CM | POA: Diagnosis not present

## 2022-11-06 DIAGNOSIS — J4520 Mild intermittent asthma, uncomplicated: Secondary | ICD-10-CM | POA: Diagnosis not present

## 2022-11-06 DIAGNOSIS — F411 Generalized anxiety disorder: Secondary | ICD-10-CM | POA: Diagnosis not present

## 2022-11-11 DIAGNOSIS — F411 Generalized anxiety disorder: Secondary | ICD-10-CM | POA: Diagnosis not present

## 2022-11-11 DIAGNOSIS — K219 Gastro-esophageal reflux disease without esophagitis: Secondary | ICD-10-CM | POA: Diagnosis not present

## 2022-11-11 DIAGNOSIS — I739 Peripheral vascular disease, unspecified: Secondary | ICD-10-CM | POA: Diagnosis not present

## 2022-11-11 DIAGNOSIS — I10 Essential (primary) hypertension: Secondary | ICD-10-CM | POA: Diagnosis not present

## 2022-11-11 DIAGNOSIS — E785 Hyperlipidemia, unspecified: Secondary | ICD-10-CM | POA: Diagnosis not present

## 2022-11-11 DIAGNOSIS — I959 Hypotension, unspecified: Secondary | ICD-10-CM | POA: Diagnosis not present

## 2022-11-11 DIAGNOSIS — R011 Cardiac murmur, unspecified: Secondary | ICD-10-CM | POA: Diagnosis not present

## 2022-11-11 DIAGNOSIS — J4520 Mild intermittent asthma, uncomplicated: Secondary | ICD-10-CM | POA: Diagnosis not present

## 2022-11-13 ENCOUNTER — Other Ambulatory Visit: Payer: Self-pay | Admitting: Internal Medicine

## 2022-11-13 ENCOUNTER — Other Ambulatory Visit: Payer: Self-pay

## 2022-11-13 DIAGNOSIS — I701 Atherosclerosis of renal artery: Secondary | ICD-10-CM

## 2022-11-13 DIAGNOSIS — I10 Essential (primary) hypertension: Secondary | ICD-10-CM

## 2022-11-17 ENCOUNTER — Ambulatory Visit
Admission: RE | Admit: 2022-11-17 | Discharge: 2022-11-17 | Disposition: A | Discharge status: Home / Self Care | Payer: PPO | Source: Ambulatory Visit | Attending: Internal Medicine | Admitting: Internal Medicine

## 2022-11-17 DIAGNOSIS — I701 Atherosclerosis of renal artery: Secondary | ICD-10-CM | POA: Insufficient documentation

## 2022-11-17 DIAGNOSIS — I10 Essential (primary) hypertension: Secondary | ICD-10-CM | POA: Insufficient documentation

## 2022-11-18 ENCOUNTER — Other Ambulatory Visit: Payer: Self-pay | Admitting: Internal Medicine

## 2022-11-18 DIAGNOSIS — I10 Essential (primary) hypertension: Secondary | ICD-10-CM

## 2022-11-18 DIAGNOSIS — I701 Atherosclerosis of renal artery: Secondary | ICD-10-CM

## 2022-11-19 ENCOUNTER — Ambulatory Visit: Admission: RE | Admit: 2022-11-19 | Payer: PPO | Source: Ambulatory Visit

## 2022-11-23 ENCOUNTER — Other Ambulatory Visit: Payer: Self-pay | Admitting: Internal Medicine

## 2022-11-23 DIAGNOSIS — R079 Chest pain, unspecified: Secondary | ICD-10-CM

## 2022-11-23 DIAGNOSIS — R931 Abnormal findings on diagnostic imaging of heart and coronary circulation: Secondary | ICD-10-CM

## 2022-11-25 ENCOUNTER — Telehealth (HOSPITAL_COMMUNITY): Payer: Self-pay | Admitting: Emergency Medicine

## 2022-11-25 ENCOUNTER — Ambulatory Visit: Payer: PPO

## 2022-11-25 NOTE — Telephone Encounter (Signed)
Attempted to call patient regarding upcoming cardiac CT appointment. °Left message on voicemail with name and callback number °Saanvika Vazques RN Navigator Cardiac Imaging ° [facility] °[phone] Office °[phone] Cell ° °

## 2022-11-26 ENCOUNTER — Ambulatory Visit
Admission: RE | Admit: 2022-11-26 | Discharge: 2022-11-26 | Disposition: A | Discharge status: Home / Self Care | Payer: PPO | Source: Ambulatory Visit | Attending: Internal Medicine | Admitting: Internal Medicine

## 2022-11-26 ENCOUNTER — Other Ambulatory Visit: Payer: Self-pay | Admitting: Internal Medicine

## 2022-11-26 DIAGNOSIS — I2510 Atherosclerotic heart disease of native coronary artery without angina pectoris: Secondary | ICD-10-CM | POA: Diagnosis not present

## 2022-11-26 DIAGNOSIS — I10 Essential (primary) hypertension: Secondary | ICD-10-CM | POA: Insufficient documentation

## 2022-11-26 DIAGNOSIS — R931 Abnormal findings on diagnostic imaging of heart and coronary circulation: Secondary | ICD-10-CM | POA: Diagnosis not present

## 2022-11-26 DIAGNOSIS — R079 Chest pain, unspecified: Secondary | ICD-10-CM | POA: Diagnosis not present

## 2022-11-26 DIAGNOSIS — I701 Atherosclerosis of renal artery: Secondary | ICD-10-CM | POA: Insufficient documentation

## 2022-11-26 DIAGNOSIS — N281 Cyst of kidney, acquired: Secondary | ICD-10-CM | POA: Diagnosis not present

## 2022-11-26 MED ORDER — IOHEXOL 350 MG/ML SOLN
75.0000 mL | Freq: Once | INTRAVENOUS | Status: AC | PRN
  Administered 2022-11-26: 75 mL via INTRAVENOUS

## 2022-11-26 MED ORDER — NITROGLYCERIN 0.4 MG SL SUBL
0.8000 mg | SUBLINGUAL_TABLET | Freq: Once | SUBLINGUAL | Status: AC
  Administered 2022-11-26: 0.8 mg via SUBLINGUAL

## 2022-12-16 DIAGNOSIS — E785 Hyperlipidemia, unspecified: Secondary | ICD-10-CM | POA: Diagnosis not present

## 2022-12-16 DIAGNOSIS — R0609 Other forms of dyspnea: Secondary | ICD-10-CM | POA: Diagnosis not present

## 2022-12-16 DIAGNOSIS — R931 Abnormal findings on diagnostic imaging of heart and coronary circulation: Secondary | ICD-10-CM | POA: Diagnosis not present

## 2022-12-16 DIAGNOSIS — I959 Hypotension, unspecified: Secondary | ICD-10-CM | POA: Diagnosis not present

## 2022-12-16 DIAGNOSIS — R011 Cardiac murmur, unspecified: Secondary | ICD-10-CM | POA: Diagnosis not present

## 2022-12-16 DIAGNOSIS — I779 Disorder of arteries and arterioles, unspecified: Secondary | ICD-10-CM | POA: Diagnosis not present

## 2022-12-16 DIAGNOSIS — K219 Gastro-esophageal reflux disease without esophagitis: Secondary | ICD-10-CM | POA: Diagnosis not present

## 2022-12-16 DIAGNOSIS — F411 Generalized anxiety disorder: Secondary | ICD-10-CM | POA: Diagnosis not present

## 2022-12-16 DIAGNOSIS — I739 Peripheral vascular disease, unspecified: Secondary | ICD-10-CM | POA: Diagnosis not present

## 2022-12-16 DIAGNOSIS — R079 Chest pain, unspecified: Secondary | ICD-10-CM | POA: Diagnosis not present

## 2022-12-16 DIAGNOSIS — I10 Essential (primary) hypertension: Secondary | ICD-10-CM | POA: Diagnosis not present

## 2022-12-23 ENCOUNTER — Other Ambulatory Visit: Payer: PPO

## 2023-01-15 DIAGNOSIS — F411 Generalized anxiety disorder: Secondary | ICD-10-CM | POA: Diagnosis not present

## 2023-01-15 DIAGNOSIS — E785 Hyperlipidemia, unspecified: Secondary | ICD-10-CM | POA: Diagnosis not present

## 2023-01-15 DIAGNOSIS — I639 Cerebral infarction, unspecified: Secondary | ICD-10-CM | POA: Diagnosis not present

## 2023-01-15 DIAGNOSIS — R931 Abnormal findings on diagnostic imaging of heart and coronary circulation: Secondary | ICD-10-CM | POA: Diagnosis not present

## 2023-01-15 DIAGNOSIS — I779 Disorder of arteries and arterioles, unspecified: Secondary | ICD-10-CM | POA: Diagnosis not present

## 2023-01-15 DIAGNOSIS — J4520 Mild intermittent asthma, uncomplicated: Secondary | ICD-10-CM | POA: Diagnosis not present

## 2023-01-15 DIAGNOSIS — R011 Cardiac murmur, unspecified: Secondary | ICD-10-CM | POA: Diagnosis not present

## 2023-01-15 DIAGNOSIS — I739 Peripheral vascular disease, unspecified: Secondary | ICD-10-CM | POA: Diagnosis not present

## 2023-01-15 DIAGNOSIS — K219 Gastro-esophageal reflux disease without esophagitis: Secondary | ICD-10-CM | POA: Diagnosis not present

## 2023-01-15 DIAGNOSIS — I10 Essential (primary) hypertension: Secondary | ICD-10-CM | POA: Diagnosis not present

## 2023-01-29 DIAGNOSIS — D225 Melanocytic nevi of trunk: Secondary | ICD-10-CM | POA: Diagnosis not present

## 2023-01-29 DIAGNOSIS — R208 Other disturbances of skin sensation: Secondary | ICD-10-CM | POA: Diagnosis not present

## 2023-01-29 DIAGNOSIS — R202 Paresthesia of skin: Secondary | ICD-10-CM | POA: Diagnosis not present

## 2023-01-29 DIAGNOSIS — L821 Other seborrheic keratosis: Secondary | ICD-10-CM | POA: Diagnosis not present

## 2023-01-29 DIAGNOSIS — D485 Neoplasm of uncertain behavior of skin: Secondary | ICD-10-CM | POA: Diagnosis not present

## 2023-01-29 DIAGNOSIS — Z09 Encounter for follow-up examination after completed treatment for conditions other than malignant neoplasm: Secondary | ICD-10-CM | POA: Diagnosis not present

## 2023-01-29 DIAGNOSIS — Z872 Personal history of diseases of the skin and subcutaneous tissue: Secondary | ICD-10-CM | POA: Diagnosis not present

## 2023-01-29 DIAGNOSIS — L820 Inflamed seborrheic keratosis: Secondary | ICD-10-CM | POA: Diagnosis not present

## 2023-01-29 DIAGNOSIS — C4441 Basal cell carcinoma of skin of scalp and neck: Secondary | ICD-10-CM | POA: Diagnosis not present

## 2023-02-11 DIAGNOSIS — H47011 Ischemic optic neuropathy, right eye: Secondary | ICD-10-CM | POA: Diagnosis not present

## 2023-02-23 DIAGNOSIS — C4441 Basal cell carcinoma of skin of scalp and neck: Secondary | ICD-10-CM | POA: Diagnosis not present

## 2023-02-25 DIAGNOSIS — H6123 Impacted cerumen, bilateral: Secondary | ICD-10-CM | POA: Diagnosis not present

## 2023-02-25 DIAGNOSIS — K219 Gastro-esophageal reflux disease without esophagitis: Secondary | ICD-10-CM | POA: Diagnosis not present

## 2023-03-04 ENCOUNTER — Other Ambulatory Visit (INDEPENDENT_AMBULATORY_CARE_PROVIDER_SITE_OTHER): Payer: Self-pay | Admitting: Nurse Practitioner

## 2023-03-04 DIAGNOSIS — I6523 Occlusion and stenosis of bilateral carotid arteries: Secondary | ICD-10-CM

## 2023-03-09 DIAGNOSIS — D485 Neoplasm of uncertain behavior of skin: Secondary | ICD-10-CM | POA: Diagnosis not present

## 2023-03-09 DIAGNOSIS — C44729 Squamous cell carcinoma of skin of left lower limb, including hip: Secondary | ICD-10-CM | POA: Diagnosis not present

## 2023-03-16 ENCOUNTER — Ambulatory Visit (INDEPENDENT_AMBULATORY_CARE_PROVIDER_SITE_OTHER): Payer: PPO | Admitting: Vascular Surgery

## 2023-03-16 ENCOUNTER — Encounter (INDEPENDENT_AMBULATORY_CARE_PROVIDER_SITE_OTHER): Payer: PPO

## 2023-04-06 DIAGNOSIS — C44722 Squamous cell carcinoma of skin of right lower limb, including hip: Secondary | ICD-10-CM | POA: Diagnosis not present

## 2023-04-06 DIAGNOSIS — D2371 Other benign neoplasm of skin of right lower limb, including hip: Secondary | ICD-10-CM | POA: Diagnosis not present

## 2023-04-19 DIAGNOSIS — I779 Disorder of arteries and arterioles, unspecified: Secondary | ICD-10-CM | POA: Diagnosis not present

## 2023-04-19 DIAGNOSIS — E785 Hyperlipidemia, unspecified: Secondary | ICD-10-CM | POA: Diagnosis not present

## 2023-04-19 DIAGNOSIS — I639 Cerebral infarction, unspecified: Secondary | ICD-10-CM | POA: Diagnosis not present

## 2023-04-19 DIAGNOSIS — K219 Gastro-esophageal reflux disease without esophagitis: Secondary | ICD-10-CM | POA: Diagnosis not present

## 2023-04-19 DIAGNOSIS — J4520 Mild intermittent asthma, uncomplicated: Secondary | ICD-10-CM | POA: Diagnosis not present

## 2023-04-19 DIAGNOSIS — I10 Essential (primary) hypertension: Secondary | ICD-10-CM | POA: Diagnosis not present

## 2023-04-19 DIAGNOSIS — F411 Generalized anxiety disorder: Secondary | ICD-10-CM | POA: Diagnosis not present

## 2023-04-19 DIAGNOSIS — I739 Peripheral vascular disease, unspecified: Secondary | ICD-10-CM | POA: Diagnosis not present

## 2023-04-19 DIAGNOSIS — E7800 Pure hypercholesterolemia, unspecified: Secondary | ICD-10-CM | POA: Diagnosis not present

## 2023-04-19 DIAGNOSIS — R931 Abnormal findings on diagnostic imaging of heart and coronary circulation: Secondary | ICD-10-CM | POA: Diagnosis not present

## 2023-04-19 DIAGNOSIS — R011 Cardiac murmur, unspecified: Secondary | ICD-10-CM | POA: Diagnosis not present

## 2023-04-29 DIAGNOSIS — F411 Generalized anxiety disorder: Secondary | ICD-10-CM | POA: Diagnosis not present

## 2023-04-29 DIAGNOSIS — I10 Essential (primary) hypertension: Secondary | ICD-10-CM | POA: Diagnosis not present

## 2023-04-29 DIAGNOSIS — E7800 Pure hypercholesterolemia, unspecified: Secondary | ICD-10-CM | POA: Diagnosis not present

## 2023-04-29 DIAGNOSIS — K219 Gastro-esophageal reflux disease without esophagitis: Secondary | ICD-10-CM | POA: Diagnosis not present

## 2023-05-02 IMAGING — MG MM DIGITAL SCREENING BILAT W/ TOMO AND CAD
8 series · 8 of 24 positions shown · non-contrast
Comparison: Previous exam(s).

CLINICAL DATA: Screening.

EXAM:
DIGITAL SCREENING BILATERAL MAMMOGRAM WITH TOMOSYNTHESIS AND CAD
TECHNIQUE: Bilateral screening digital craniocaudal and mediolateral oblique
mammograms were obtained. Bilateral screening digital breast
tomosynthesis was performed. The images were evaluated with
computer-aided detection.

[R MLO synth-2D]
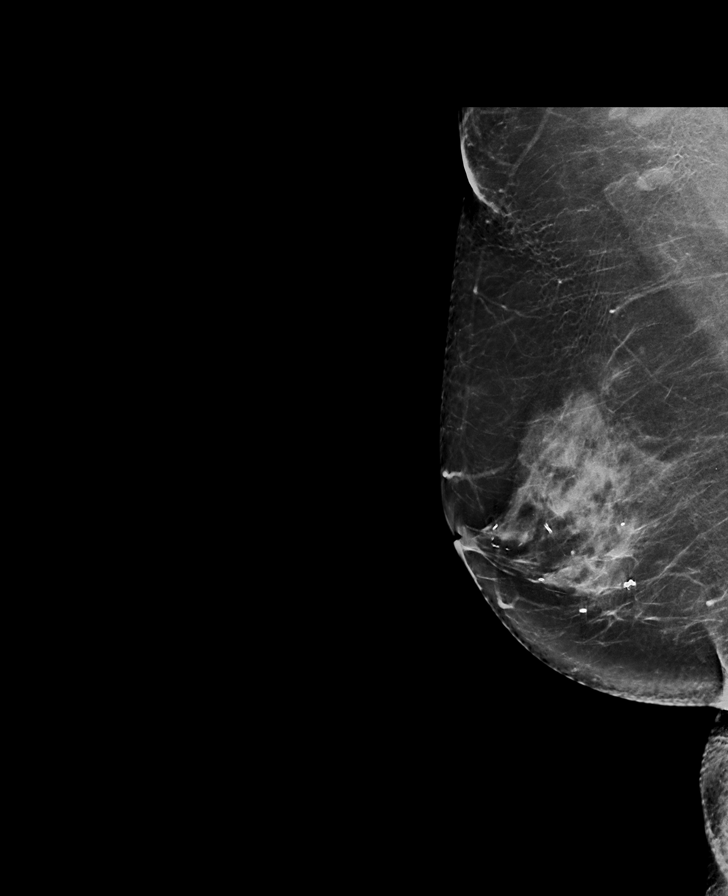

[L MLO synth-2D]
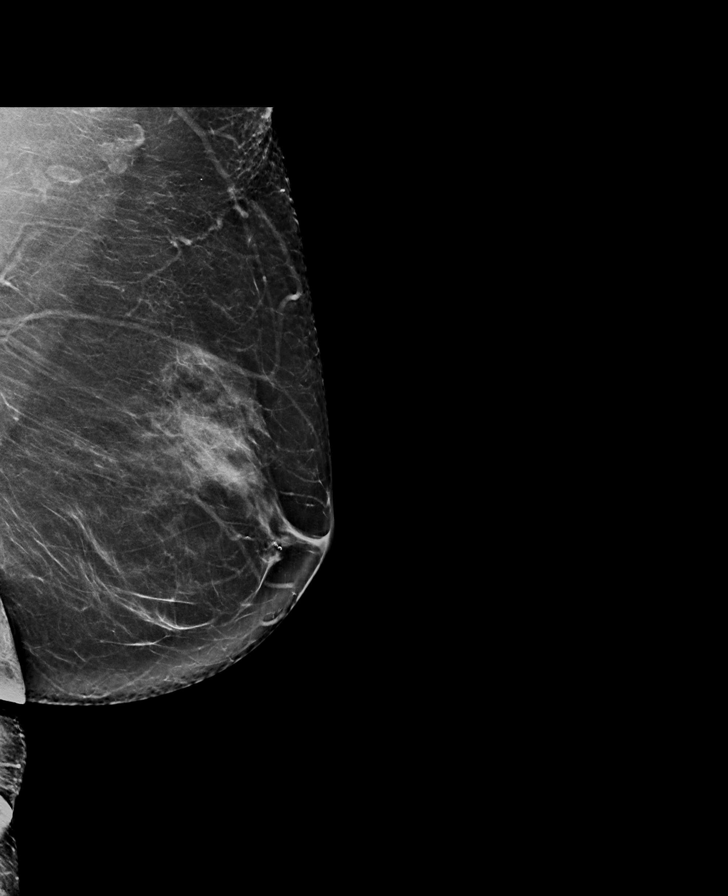

[R CC synth-2D]
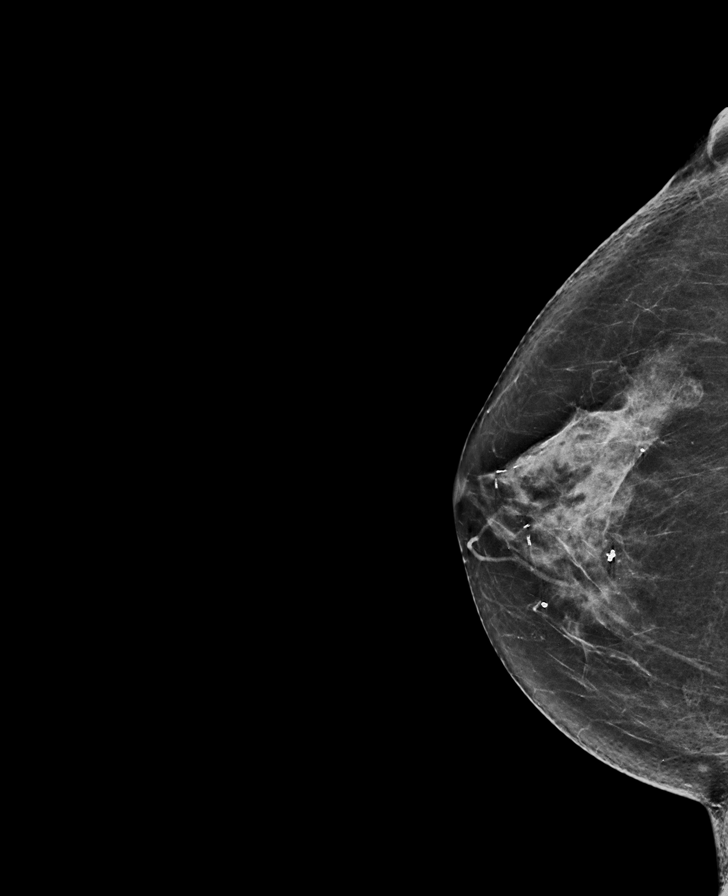

[L CC synth-2D]
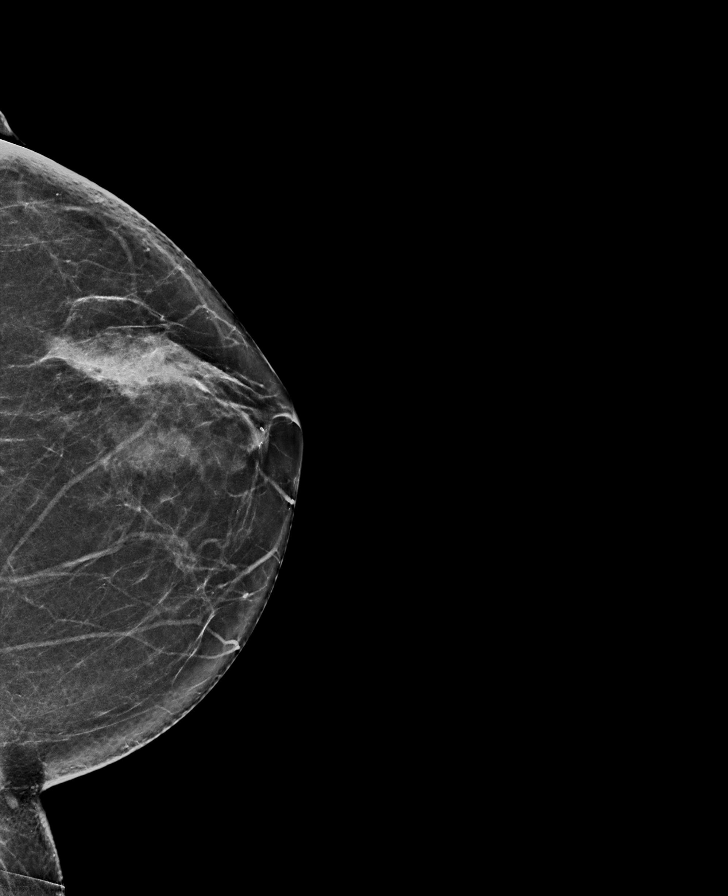

[R MLO tomo · tomo slice 41/81.0]
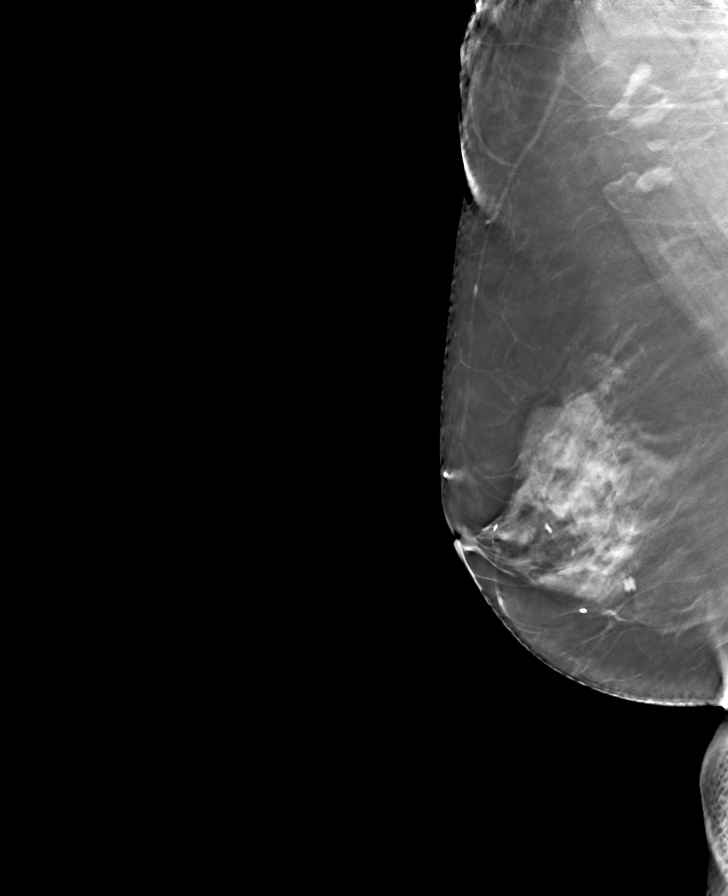

[L MLO tomo · tomo slice 40/79.0]
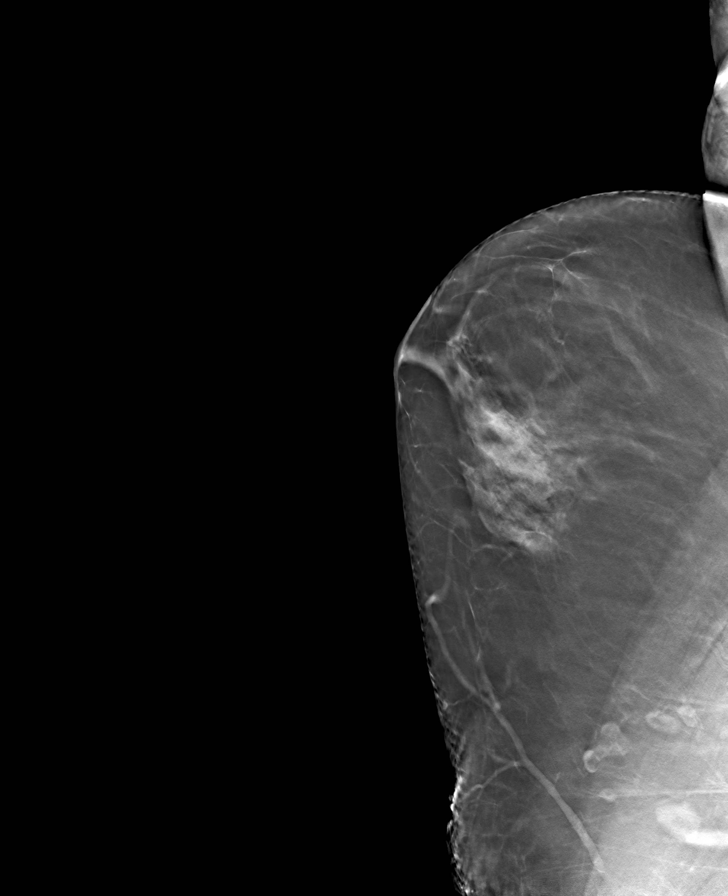

[L CC tomo · tomo slice 34/67.0]
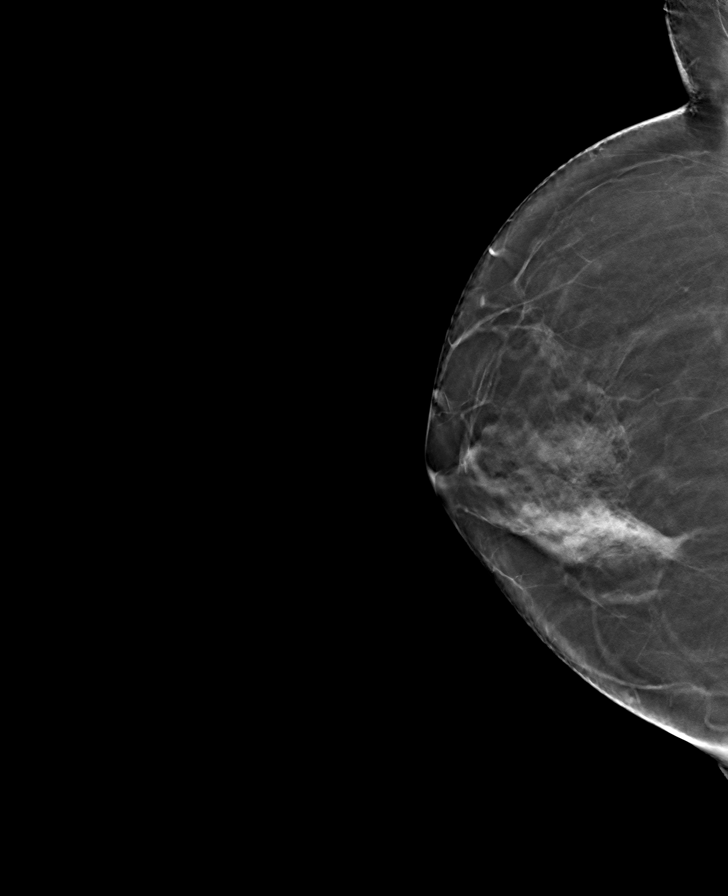

[R CC tomo · tomo slice 33/65.0]
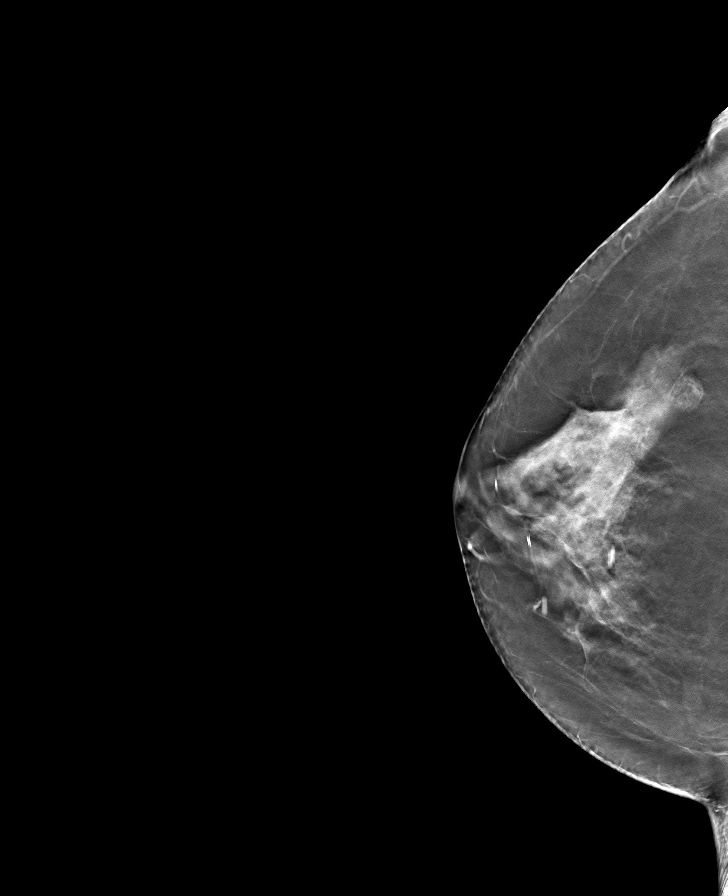

[8 of 24 positions shown; findings below may reference images not displayed]

ACR Breast Density Category c: The breast tissue is heterogeneously
dense, which may obscure small masses.
FINDINGS: There are no findings suspicious for malignancy.
IMPRESSION: No mammographic evidence of malignancy. A result letter of this
screening mammogram will be mailed directly to the patient.

RECOMMENDATION:
Screening mammogram in one year. (Code:Q3-W-BC3)

BI-RADS CATEGORY  1: Negative.

## 2023-05-03 DIAGNOSIS — M170 Bilateral primary osteoarthritis of knee: Secondary | ICD-10-CM | POA: Diagnosis not present

## 2023-05-19 DIAGNOSIS — M1712 Unilateral primary osteoarthritis, left knee: Secondary | ICD-10-CM | POA: Diagnosis not present

## 2023-05-19 DIAGNOSIS — M170 Bilateral primary osteoarthritis of knee: Secondary | ICD-10-CM | POA: Diagnosis not present

## 2023-05-31 ENCOUNTER — Other Ambulatory Visit: Payer: Self-pay | Admitting: Family Medicine

## 2023-05-31 DIAGNOSIS — Z1231 Encounter for screening mammogram for malignant neoplasm of breast: Secondary | ICD-10-CM

## 2023-05-31 DIAGNOSIS — M25662 Stiffness of left knee, not elsewhere classified: Secondary | ICD-10-CM | POA: Diagnosis not present

## 2023-06-07 DIAGNOSIS — M25662 Stiffness of left knee, not elsewhere classified: Secondary | ICD-10-CM | POA: Diagnosis not present

## 2023-06-09 DIAGNOSIS — M25662 Stiffness of left knee, not elsewhere classified: Secondary | ICD-10-CM | POA: Diagnosis not present

## 2023-06-11 DIAGNOSIS — M25662 Stiffness of left knee, not elsewhere classified: Secondary | ICD-10-CM | POA: Diagnosis not present

## 2023-06-14 DIAGNOSIS — M1712 Unilateral primary osteoarthritis, left knee: Secondary | ICD-10-CM | POA: Diagnosis not present

## 2023-06-25 DIAGNOSIS — M25662 Stiffness of left knee, not elsewhere classified: Secondary | ICD-10-CM | POA: Diagnosis not present

## 2023-06-30 DIAGNOSIS — M25662 Stiffness of left knee, not elsewhere classified: Secondary | ICD-10-CM | POA: Diagnosis not present

## 2023-07-06 ENCOUNTER — Ambulatory Visit
Admission: RE | Admit: 2023-07-06 | Discharge: 2023-07-06 | Disposition: A | Discharge status: Home / Self Care | Payer: PPO | Source: Ambulatory Visit | Attending: Family Medicine | Admitting: Family Medicine

## 2023-07-06 DIAGNOSIS — Z1231 Encounter for screening mammogram for malignant neoplasm of breast: Secondary | ICD-10-CM | POA: Diagnosis not present

## 2023-07-09 DIAGNOSIS — M25662 Stiffness of left knee, not elsewhere classified: Secondary | ICD-10-CM | POA: Diagnosis not present

## 2023-08-05 DIAGNOSIS — R011 Cardiac murmur, unspecified: Secondary | ICD-10-CM | POA: Diagnosis not present

## 2023-08-05 DIAGNOSIS — F411 Generalized anxiety disorder: Secondary | ICD-10-CM | POA: Diagnosis not present

## 2023-08-05 DIAGNOSIS — K219 Gastro-esophageal reflux disease without esophagitis: Secondary | ICD-10-CM | POA: Diagnosis not present

## 2023-08-05 DIAGNOSIS — R931 Abnormal findings on diagnostic imaging of heart and coronary circulation: Secondary | ICD-10-CM | POA: Diagnosis not present

## 2023-08-05 DIAGNOSIS — I10 Essential (primary) hypertension: Secondary | ICD-10-CM | POA: Diagnosis not present

## 2023-08-05 DIAGNOSIS — I779 Disorder of arteries and arterioles, unspecified: Secondary | ICD-10-CM | POA: Diagnosis not present

## 2023-08-05 DIAGNOSIS — E785 Hyperlipidemia, unspecified: Secondary | ICD-10-CM | POA: Diagnosis not present

## 2023-08-05 DIAGNOSIS — J4520 Mild intermittent asthma, uncomplicated: Secondary | ICD-10-CM | POA: Diagnosis not present

## 2023-08-05 DIAGNOSIS — I639 Cerebral infarction, unspecified: Secondary | ICD-10-CM | POA: Diagnosis not present

## 2023-08-05 DIAGNOSIS — I739 Peripheral vascular disease, unspecified: Secondary | ICD-10-CM | POA: Diagnosis not present

## 2023-08-26 DIAGNOSIS — H6121 Impacted cerumen, right ear: Secondary | ICD-10-CM | POA: Diagnosis not present

## 2023-08-26 DIAGNOSIS — H6063 Unspecified chronic otitis externa, bilateral: Secondary | ICD-10-CM | POA: Diagnosis not present

## 2023-09-13 DIAGNOSIS — D225 Melanocytic nevi of trunk: Secondary | ICD-10-CM | POA: Diagnosis not present

## 2023-09-13 DIAGNOSIS — L821 Other seborrheic keratosis: Secondary | ICD-10-CM | POA: Diagnosis not present

## 2023-09-13 DIAGNOSIS — D2262 Melanocytic nevi of left upper limb, including shoulder: Secondary | ICD-10-CM | POA: Diagnosis not present

## 2023-09-13 DIAGNOSIS — D2272 Melanocytic nevi of left lower limb, including hip: Secondary | ICD-10-CM | POA: Diagnosis not present

## 2023-09-13 DIAGNOSIS — D2271 Melanocytic nevi of right lower limb, including hip: Secondary | ICD-10-CM | POA: Diagnosis not present

## 2023-09-13 DIAGNOSIS — D2261 Melanocytic nevi of right upper limb, including shoulder: Secondary | ICD-10-CM | POA: Diagnosis not present

## 2023-09-23 DIAGNOSIS — M1712 Unilateral primary osteoarthritis, left knee: Secondary | ICD-10-CM | POA: Diagnosis not present

## 2023-10-13 DIAGNOSIS — M25662 Stiffness of left knee, not elsewhere classified: Secondary | ICD-10-CM | POA: Diagnosis not present

## 2023-10-18 DIAGNOSIS — M25662 Stiffness of left knee, not elsewhere classified: Secondary | ICD-10-CM | POA: Diagnosis not present

## 2023-10-21 DIAGNOSIS — I10 Essential (primary) hypertension: Secondary | ICD-10-CM | POA: Diagnosis not present

## 2023-10-21 DIAGNOSIS — E7800 Pure hypercholesterolemia, unspecified: Secondary | ICD-10-CM | POA: Diagnosis not present

## 2023-10-25 DIAGNOSIS — M25662 Stiffness of left knee, not elsewhere classified: Secondary | ICD-10-CM | POA: Diagnosis not present

## 2023-10-28 DIAGNOSIS — Z0000 Encounter for general adult medical examination without abnormal findings: Secondary | ICD-10-CM | POA: Diagnosis not present

## 2023-10-28 DIAGNOSIS — E7800 Pure hypercholesterolemia, unspecified: Secondary | ICD-10-CM | POA: Diagnosis not present

## 2023-10-28 DIAGNOSIS — I10 Essential (primary) hypertension: Secondary | ICD-10-CM | POA: Diagnosis not present

## 2023-11-09 ENCOUNTER — Encounter (INDEPENDENT_AMBULATORY_CARE_PROVIDER_SITE_OTHER): Payer: Self-pay

## 2023-11-10 DIAGNOSIS — M25662 Stiffness of left knee, not elsewhere classified: Secondary | ICD-10-CM | POA: Diagnosis not present

## 2023-11-18 DIAGNOSIS — M25662 Stiffness of left knee, not elsewhere classified: Secondary | ICD-10-CM | POA: Diagnosis not present

## 2023-11-24 DIAGNOSIS — M25662 Stiffness of left knee, not elsewhere classified: Secondary | ICD-10-CM | POA: Diagnosis not present

## 2023-12-13 DIAGNOSIS — M1711 Unilateral primary osteoarthritis, right knee: Secondary | ICD-10-CM | POA: Diagnosis not present

## 2023-12-13 DIAGNOSIS — S80212A Abrasion, left knee, initial encounter: Secondary | ICD-10-CM | POA: Diagnosis not present

## 2023-12-13 DIAGNOSIS — M1712 Unilateral primary osteoarthritis, left knee: Secondary | ICD-10-CM | POA: Diagnosis not present

## 2023-12-22 DIAGNOSIS — L538 Other specified erythematous conditions: Secondary | ICD-10-CM | POA: Diagnosis not present

## 2023-12-22 DIAGNOSIS — R208 Other disturbances of skin sensation: Secondary | ICD-10-CM | POA: Diagnosis not present

## 2023-12-22 DIAGNOSIS — D485 Neoplasm of uncertain behavior of skin: Secondary | ICD-10-CM | POA: Diagnosis not present

## 2023-12-22 DIAGNOSIS — L820 Inflamed seborrheic keratosis: Secondary | ICD-10-CM | POA: Diagnosis not present

## 2023-12-22 DIAGNOSIS — L570 Actinic keratosis: Secondary | ICD-10-CM | POA: Diagnosis not present

## 2023-12-22 DIAGNOSIS — L918 Other hypertrophic disorders of the skin: Secondary | ICD-10-CM | POA: Diagnosis not present

## 2023-12-22 DIAGNOSIS — L821 Other seborrheic keratosis: Secondary | ICD-10-CM | POA: Diagnosis not present

## 2024-01-10 DIAGNOSIS — L570 Actinic keratosis: Secondary | ICD-10-CM | POA: Diagnosis not present

## 2024-01-21 DIAGNOSIS — I639 Cerebral infarction, unspecified: Secondary | ICD-10-CM | POA: Diagnosis not present

## 2024-01-21 DIAGNOSIS — E785 Hyperlipidemia, unspecified: Secondary | ICD-10-CM | POA: Diagnosis not present

## 2024-01-21 DIAGNOSIS — J4520 Mild intermittent asthma, uncomplicated: Secondary | ICD-10-CM | POA: Diagnosis not present

## 2024-01-21 DIAGNOSIS — F411 Generalized anxiety disorder: Secondary | ICD-10-CM | POA: Diagnosis not present

## 2024-01-21 DIAGNOSIS — I10 Essential (primary) hypertension: Secondary | ICD-10-CM | POA: Diagnosis not present

## 2024-01-21 DIAGNOSIS — R931 Abnormal findings on diagnostic imaging of heart and coronary circulation: Secondary | ICD-10-CM | POA: Diagnosis not present

## 2024-01-21 DIAGNOSIS — R011 Cardiac murmur, unspecified: Secondary | ICD-10-CM | POA: Diagnosis not present

## 2024-01-21 DIAGNOSIS — I739 Peripheral vascular disease, unspecified: Secondary | ICD-10-CM | POA: Diagnosis not present

## 2024-01-21 DIAGNOSIS — K219 Gastro-esophageal reflux disease without esophagitis: Secondary | ICD-10-CM | POA: Diagnosis not present

## 2024-02-08 DIAGNOSIS — M170 Bilateral primary osteoarthritis of knee: Secondary | ICD-10-CM | POA: Diagnosis not present

## 2024-02-15 DIAGNOSIS — M170 Bilateral primary osteoarthritis of knee: Secondary | ICD-10-CM | POA: Diagnosis not present

## 2024-02-17 DIAGNOSIS — H47011 Ischemic optic neuropathy, right eye: Secondary | ICD-10-CM | POA: Diagnosis not present

## 2024-02-22 DIAGNOSIS — M170 Bilateral primary osteoarthritis of knee: Secondary | ICD-10-CM | POA: Diagnosis not present

## 2024-02-24 DIAGNOSIS — H6123 Impacted cerumen, bilateral: Secondary | ICD-10-CM | POA: Diagnosis not present

## 2024-02-24 DIAGNOSIS — J301 Allergic rhinitis due to pollen: Secondary | ICD-10-CM | POA: Diagnosis not present

## 2024-02-24 DIAGNOSIS — J342 Deviated nasal septum: Secondary | ICD-10-CM | POA: Diagnosis not present

## 2024-04-24 DIAGNOSIS — I10 Essential (primary) hypertension: Secondary | ICD-10-CM | POA: Diagnosis not present

## 2024-04-24 DIAGNOSIS — E7800 Pure hypercholesterolemia, unspecified: Secondary | ICD-10-CM | POA: Diagnosis not present

## 2024-05-01 DIAGNOSIS — D72819 Decreased white blood cell count, unspecified: Secondary | ICD-10-CM | POA: Diagnosis not present

## 2024-05-01 DIAGNOSIS — E7800 Pure hypercholesterolemia, unspecified: Secondary | ICD-10-CM | POA: Diagnosis not present

## 2024-05-01 DIAGNOSIS — I10 Essential (primary) hypertension: Secondary | ICD-10-CM | POA: Diagnosis not present

## 2024-05-01 DIAGNOSIS — K219 Gastro-esophageal reflux disease without esophagitis: Secondary | ICD-10-CM | POA: Diagnosis not present

## 2024-05-12 NOTE — Progress Notes (Signed)
 Patricia Bullock                                          MRN: [id number]   05/12/2024   The [facility] Quality Team Specialist reviewed this patient medical record for the purposes of chart review for care gap closure. The following were reviewed: abstraction for care gap closure-controlling blood pressure.    [facility] Quality Team

## 2024-05-15 DIAGNOSIS — L821 Other seborrheic keratosis: Secondary | ICD-10-CM | POA: Diagnosis not present

## 2024-05-15 DIAGNOSIS — Z08 Encounter for follow-up examination after completed treatment for malignant neoplasm: Secondary | ICD-10-CM | POA: Diagnosis not present

## 2024-05-15 DIAGNOSIS — Z09 Encounter for follow-up examination after completed treatment for conditions other than malignant neoplasm: Secondary | ICD-10-CM | POA: Diagnosis not present

## 2024-05-15 DIAGNOSIS — Z85828 Personal history of other malignant neoplasm of skin: Secondary | ICD-10-CM | POA: Diagnosis not present

## 2024-05-15 DIAGNOSIS — D2262 Melanocytic nevi of left upper limb, including shoulder: Secondary | ICD-10-CM | POA: Diagnosis not present

## 2024-05-15 DIAGNOSIS — D2271 Melanocytic nevi of right lower limb, including hip: Secondary | ICD-10-CM | POA: Diagnosis not present

## 2024-05-15 DIAGNOSIS — D2261 Melanocytic nevi of right upper limb, including shoulder: Secondary | ICD-10-CM | POA: Diagnosis not present

## 2024-05-15 DIAGNOSIS — Z872 Personal history of diseases of the skin and subcutaneous tissue: Secondary | ICD-10-CM | POA: Diagnosis not present

## 2024-05-15 DIAGNOSIS — D225 Melanocytic nevi of trunk: Secondary | ICD-10-CM | POA: Diagnosis not present

## 2024-05-15 DIAGNOSIS — D2272 Melanocytic nevi of left lower limb, including hip: Secondary | ICD-10-CM | POA: Diagnosis not present

## 2024-05-29 ENCOUNTER — Other Ambulatory Visit: Payer: Self-pay | Admitting: Family Medicine

## 2024-05-29 DIAGNOSIS — Z1231 Encounter for screening mammogram for malignant neoplasm of breast: Secondary | ICD-10-CM

## 2024-07-06 ENCOUNTER — Ambulatory Visit
Admission: RE | Admit: 2024-07-06 | Discharge: 2024-07-06 | Disposition: A | Discharge status: Home / Self Care | Source: Ambulatory Visit | Attending: Family Medicine | Admitting: Family Medicine

## 2024-07-06 DIAGNOSIS — Z1231 Encounter for screening mammogram for malignant neoplasm of breast: Secondary | ICD-10-CM | POA: Insufficient documentation
